# Patient Record
Sex: Female | Born: 1960 | Race: Black or African American | Hispanic: No | Marital: Single | Smoking: Never smoker
Health system: Southern US, Community
[De-identification: ages and names within clinical notes are randomized; demographics above are authoritative.]

## PROBLEM LIST (undated history)

## (undated) DIAGNOSIS — J329 Chronic sinusitis, unspecified: Secondary | ICD-10-CM

## (undated) DIAGNOSIS — E559 Vitamin D deficiency, unspecified: Secondary | ICD-10-CM

## (undated) DIAGNOSIS — R112 Nausea with vomiting, unspecified: Secondary | ICD-10-CM

## (undated) DIAGNOSIS — Z9889 Other specified postprocedural states: Secondary | ICD-10-CM

## (undated) DIAGNOSIS — E7800 Pure hypercholesterolemia, unspecified: Secondary | ICD-10-CM

## (undated) DIAGNOSIS — I10 Essential (primary) hypertension: Secondary | ICD-10-CM

## (undated) DIAGNOSIS — R7303 Prediabetes: Secondary | ICD-10-CM

## (undated) DIAGNOSIS — M1990 Unspecified osteoarthritis, unspecified site: Secondary | ICD-10-CM

## (undated) DIAGNOSIS — Z780 Asymptomatic menopausal state: Secondary | ICD-10-CM

## (undated) HISTORY — PX: COLONOSCOPY: SHX174

## (undated) HISTORY — DX: Prediabetes: R73.03

## (undated) HISTORY — PX: REDUCTION MAMMAPLASTY: SUR839

## (undated) HISTORY — DX: Vitamin D deficiency, unspecified: E55.9

## (undated) HISTORY — DX: Pure hypercholesterolemia, unspecified: E78.00

## (undated) HISTORY — PX: TUBAL LIGATION: SHX77

## (undated) HISTORY — PX: OTHER SURGICAL HISTORY: SHX169

## (undated) HISTORY — DX: Asymptomatic menopausal state: Z78.0

---

## 2006-05-27 HISTORY — PX: BREAST REDUCTION SURGERY: SHX8

## 2015-05-10 ENCOUNTER — Other Ambulatory Visit: Payer: Self-pay

## 2015-05-10 ENCOUNTER — Other Ambulatory Visit: Payer: Self-pay | Admitting: Internal Medicine

## 2015-05-10 DIAGNOSIS — R58 Hemorrhage, not elsewhere classified: Secondary | ICD-10-CM

## 2015-05-10 DIAGNOSIS — N939 Abnormal uterine and vaginal bleeding, unspecified: Secondary | ICD-10-CM

## 2015-05-12 ENCOUNTER — Other Ambulatory Visit: Payer: Self-pay | Admitting: Physician Assistant

## 2015-05-12 DIAGNOSIS — N939 Abnormal uterine and vaginal bleeding, unspecified: Secondary | ICD-10-CM

## 2015-06-28 ENCOUNTER — Emergency Department (HOSPITAL_COMMUNITY)
Admission: EM | Admit: 2015-06-28 | Discharge: 2015-06-28 | Disposition: A | Discharge status: Home / Self Care | Payer: BLUE CROSS/BLUE SHIELD | Attending: Emergency Medicine | Admitting: Emergency Medicine

## 2015-06-28 ENCOUNTER — Encounter (HOSPITAL_COMMUNITY): Payer: Self-pay

## 2015-06-28 ENCOUNTER — Emergency Department (HOSPITAL_COMMUNITY): Payer: BLUE CROSS/BLUE SHIELD

## 2015-06-28 DIAGNOSIS — E876 Hypokalemia: Secondary | ICD-10-CM | POA: Insufficient documentation

## 2015-06-28 DIAGNOSIS — J0110 Acute frontal sinusitis, unspecified: Secondary | ICD-10-CM | POA: Diagnosis not present

## 2015-06-28 DIAGNOSIS — R42 Dizziness and giddiness: Secondary | ICD-10-CM | POA: Diagnosis not present

## 2015-06-28 DIAGNOSIS — Z79899 Other long term (current) drug therapy: Secondary | ICD-10-CM | POA: Insufficient documentation

## 2015-06-28 DIAGNOSIS — I10 Essential (primary) hypertension: Secondary | ICD-10-CM | POA: Diagnosis not present

## 2015-06-28 DIAGNOSIS — R079 Chest pain, unspecified: Secondary | ICD-10-CM | POA: Diagnosis not present

## 2015-06-28 HISTORY — DX: Essential (primary) hypertension: I10

## 2015-06-28 LAB — URINALYSIS, ROUTINE W REFLEX MICROSCOPIC
BILIRUBIN URINE: NEGATIVE
Glucose, UA: NEGATIVE mg/dL
KETONES UR: NEGATIVE mg/dL
Leukocytes, UA: NEGATIVE
NITRITE: NEGATIVE
Protein, ur: NEGATIVE mg/dL
Specific Gravity, Urine: 1.004 — ABNORMAL LOW (ref 1.005–1.030)
pH: 6 (ref 5.0–8.0)

## 2015-06-28 LAB — CBC
HCT: 37.1 % (ref 36.0–46.0)
Hemoglobin: 12.3 g/dL (ref 12.0–15.0)
MCH: 28.9 pg (ref 26.0–34.0)
MCHC: 33.2 g/dL (ref 30.0–36.0)
MCV: 87.1 fL (ref 78.0–100.0)
Platelets: 259 10*3/uL (ref 150–400)
RBC: 4.26 MIL/uL (ref 3.87–5.11)
RDW: 13.1 % (ref 11.5–15.5)
WBC: 6.6 10*3/uL (ref 4.0–10.5)

## 2015-06-28 LAB — BASIC METABOLIC PANEL
Anion gap: 11 (ref 5–15)
BUN: 6 mg/dL (ref 6–20)
CALCIUM: 9.5 mg/dL (ref 8.9–10.3)
CO2: 28 mmol/L (ref 22–32)
CREATININE: 0.81 mg/dL (ref 0.44–1.00)
Chloride: 95 mmol/L — ABNORMAL LOW (ref 101–111)
GFR calc Af Amer: >60 mL/min (ref >60)
GLUCOSE: 136 mg/dL — AB (ref 65–99)
Potassium: 3 mmol/L — ABNORMAL LOW (ref 3.5–5.1)
Sodium: 134 mmol/L — ABNORMAL LOW (ref 135–145)

## 2015-06-28 LAB — URINE MICROSCOPIC-ADD ON

## 2015-06-28 LAB — TROPONIN I

## 2015-06-28 MED ORDER — POTASSIUM CHLORIDE CRYS ER 20 MEQ PO TBCR: 20.0000 meq | EXTENDED_RELEASE_TABLET | Freq: Every day | ORAL | Status: DC

## 2015-06-28 MED ORDER — POTASSIUM CHLORIDE CRYS ER 20 MEQ PO TBCR
40.0000 meq | EXTENDED_RELEASE_TABLET | Freq: Once | ORAL | Status: AC
  Administered 2015-06-28: 40 meq via ORAL
  Filled 2015-06-28: qty 2

## 2015-06-28 MED ORDER — DIAZEPAM 5 MG PO TABS: 5.0000 mg | ORAL_TABLET | Freq: Once | ORAL | Status: DC

## 2015-06-28 MED ORDER — MECLIZINE HCL 50 MG PO TABS: 50.0000 mg | ORAL_TABLET | Freq: Three times a day (TID) | ORAL | Status: DC | PRN

## 2015-06-28 MED ORDER — FLUTICASONE PROPIONATE 50 MCG/ACT NA SUSP: 2.0000 | Freq: Every day | NASAL | Status: DC

## 2015-06-28 MED ORDER — DIAZEPAM 5 MG PO TABS
5.0000 mg | ORAL_TABLET | Freq: Once | ORAL | Status: AC
  Administered 2015-06-28: 5 mg via ORAL
  Filled 2015-06-28: qty 1

## 2015-06-28 MED ORDER — AMOXICILLIN-POT CLAVULANATE 875-125 MG PO TABS: 1.0000 | ORAL_TABLET | Freq: Two times a day (BID) | ORAL | Status: DC

## 2015-06-28 NOTE — ED Provider Notes (Signed)
CSN: [id number]     Arrival date & time 06/28/15  0202 History   First MD Initiated Contact with Patient 06/28/15 0510     Chief Complaint  Patient presents with  . Hypertension     (Consider location/radiation/quality/duration/timing/severity/associated sxs/prior Treatment) HPI  This is a 55 year old female who presents with high blood pressure. Patient reports that she takes blood pressure medications but it is usually in normal range. She's had a two-week history of nasal congestion, head fullness and room spinning dizziness. The dizziness is worse with position changes. She took some over-the-counter nasal drops and feels that this made her blood pressure go up. She denies any fevers. She's not taking any antibiotics. Denies any history of vertigo. Denies any weakness, numbness.  Does report bilateral upper [place] tingling after starting her new blood pressure medication, HCTZ.  Reports occasional chest pain. No active chest pain at this time.  Past Medical History  Diagnosis Date  . Hypertension    Past Surgical History  Procedure Laterality Date  . Cesarean section    . Tummy tuck     No family history on file. Social History  Substance Use Topics  . Smoking status: Never Smoker   . Smokeless tobacco: None  . Alcohol Use: No   OB History    No data available     Review of Systems  Constitutional: Negative for fever.  HENT: Positive for congestion and sinus pressure.   Respiratory: Negative for cough, chest tightness and shortness of breath.   Cardiovascular: Positive for chest pain.  Gastrointestinal: Negative for nausea, vomiting and abdominal pain.  Skin: Negative for wound.  Neurological: Positive for dizziness. Negative for weakness, light-headedness and headaches.  Psychiatric/Behavioral: Negative for confusion.  All other systems reviewed and are negative.     Allergies  Other  Home Medications   Prior to Admission medications   Medication Sig Start  Date End Date Taking? Authorizing Provider  clonazePAM (KLONOPIN) 0.5 MG tablet Take 0.25-0.5 mg by mouth 3 (three) times daily as needed for anxiety.  06/08/15  Yes Historical Provider, MD  hydrochlorothiazide (HYDRODIURIL) 12.5 MG tablet Take 12.5 mg by mouth daily. 06/23/15  Yes Historical Provider, MD  amoxicillin-clavulanate (AUGMENTIN) 875-125 MG tablet Take 1 tablet by mouth 2 (two) times daily. 06/28/15   Shon Baton, MD  diazepam (VALIUM) 5 MG tablet Take 1 tablet (5 mg total) by mouth once. 06/28/15   Shon Baton, MD  fluticasone (FLONASE) 50 MCG/ACT nasal spray Place 2 sprays into both nostrils daily. 06/28/15   Shon Baton, MD  meclizine (ANTIVERT) 50 MG tablet Take 1 tablet (50 mg total) by mouth 3 (three) times daily as needed for dizziness. 06/28/15   Shon Baton, MD  potassium chloride SA (K-DUR,KLOR-CON) 20 MEQ tablet Take 1 tablet (20 mEq total) by mouth daily. 06/28/15   Shon Baton, MD   BP 127/84 mmHg  Pulse 76  Temp(Src) 98.2 F (36.8 C) (Oral)  Resp 14  Ht  (1.626 m)  Wt 170 lb (77.111 kg)  BMI 29.17 kg/m2  SpO2 100% Physical Exam  Constitutional: She is oriented to person, place, and time. She appears well-developed and well-nourished. No distress.  HENT:  Head: Normocephalic and atraumatic.  Mouth/Throat: Oropharynx is clear and moist. No oropharyngeal exudate.  FUllness behind right TM, L TM normal  Neck: Neck supple.  Cardiovascular: Normal rate, regular rhythm and normal heart sounds.   No murmur heard. Pulmonary/Chest: Effort normal and breath sounds  normal. No respiratory distress. She has no wheezes.  Abdominal: Soft. Bowel sounds are normal. There is no tenderness. There is no rebound.  Neurological: She is alert and oriented to person, place, and time.  CN II-XI intact, 5/5 strength in all 4 ext.  No dysmetria to FNF  Skin: Skin is warm and dry.  Psychiatric: She has a normal mood and affect.  Nursing note and vitals  reviewed.   ED Course  Procedures (including critical care time) Labs Review Labs Reviewed  BASIC METABOLIC PANEL - Abnormal; Notable for the following:    Sodium 134 (*)    Potassium 3.0 (*)    Chloride 95 (*)    Glucose, Bld 136 (*)    All other components within normal limits  URINALYSIS, ROUTINE W REFLEX MICROSCOPIC (NOT AT [facility]) - Abnormal; Notable for the following:    Specific Gravity, Urine 1.004 (*)    Hgb urine dipstick SMALL (*)    All other components within normal limits  URINE MICROSCOPIC-ADD ON - Abnormal; Notable for the following:    Squamous Epithelial / LPF 6-30 (*)    Bacteria, UA RARE (*)    All other components within normal limits  CBC  TROPONIN I    Imaging Review Dg Chest 2 View  06/28/2015  CLINICAL DATA:  Dizziness and hypertension EXAM: CHEST  2 VIEW COMPARISON:  None. FINDINGS: Normal heart size and mediastinal contours. No acute infiltrate or edema. No effusion or pneumothorax. No acute osseous findings. IMPRESSION: No active cardiopulmonary disease. Electronically Signed   By: Marnee Spring M.D.   On: 06/28/2015 06:07   I have personally reviewed and evaluated these images and lab results as part of my medical decision-making.   EKG Interpretation   Date/Time:  Wednesday June 28 2015 02:14:24 EST Ventricular Rate:  83 PR Interval:  208 QRS Duration: 88 QT Interval:  368 QTC Calculation: 432 R Axis:   79 Text Interpretation:  Normal sinus rhythm Normal ECG Confirmed by Wai Litt   MD, Tanith Dagostino ([id number]) on 06/28/2015 5:25:49 AM      MDM   Final diagnoses:  Subacute frontal sinusitis  Essential hypertension  Vertigo  Hypokalemia    Patient presents with sinus pressure, congestion and dizziness.  Also noted to be hypertensive.  Nontoxic.  Initial BP 182/100 but improved to 128/85 without intervention.  Suspect subacute sinusitis and resulting vertigo.  Given 2 weeks of symptoms will treat with augmentin, nasal saline.  Patient given  valium with improvement of vertigo.  EKG, CXR and other w/u reassuring.  Was noted to be mildly hypokalemic, likely related to diuretic.  Will prescribe K-dur 20 meq x 10 days.  F/U w/ PCP for recheck.  After history, exam, and medical workup I feel the patient has been appropriately medically screened and is safe for discharge home. Pertinent diagnoses were discussed with the patient. Patient was given return precautions.     Shon Baton, MD 06/28/15 [phone]

## 2015-06-28 NOTE — Discharge Instructions (Signed)
Hypokalemia Hypokalemia means that the amount of potassium in the blood is lower than normal.Potassium is a chemical, called an electrolyte, that helps regulate the amount of fluid in the body. It also stimulates muscle contraction and helps nerves function properly.Most of the body's potassium is inside of cells, and only a very small amount is in the blood. Because the amount in the blood is so small, minor changes can be life-threatening. CAUSES  Antibiotics.  Diarrhea or vomiting.  Using laxatives too much, which can cause diarrhea.  Chronic kidney disease.  Water pills (diuretics).  Eating disorders (bulimia).  Low magnesium level.  Sweating a lot. SIGNS AND SYMPTOMS  Weakness.  Constipation.  Fatigue.  Muscle cramps.  Mental confusion.  Skipped heartbeats or irregular heartbeat (palpitations).  Tingling or numbness. DIAGNOSIS  Your health care provider can diagnose hypokalemia with blood tests. In addition to checking your potassium level, your health care provider may also check other lab tests. TREATMENT Hypokalemia can be treated with potassium supplements taken by mouth or adjustments in your current medicines. If your potassium level is very low, you may need to get potassium through a vein (IV) and be monitored in the hospital. A diet high in potassium is also helpful. Foods high in potassium are:  Nuts, such as peanuts and pistachios.  Seeds, such as sunflower seeds and pumpkin seeds.  Peas, lentils, and lima beans.  Whole grain and bran cereals and breads.  Fresh fruit and vegetables, such as apricots, avocado, bananas, cantaloupe, kiwi, oranges, tomatoes, asparagus, and potatoes.  Orange and tomato juices.  Red meats.  Fruit yogurt. HOME CARE INSTRUCTIONS  Take all medicines as prescribed by your health care provider.  Maintain a healthy diet by including nutritious food, such as fruits, vegetables, nuts, whole grains, and lean meats.  If  you are taking a laxative, be sure to follow the directions on the label. SEEK MEDICAL CARE IF:  Your weakness gets worse.  You feel your heart pounding or racing.  You are vomiting or having diarrhea.  You are diabetic and having trouble keeping your blood glucose in the normal range. SEEK IMMEDIATE MEDICAL CARE IF:  You have chest pain, shortness of breath, or dizziness.  You are vomiting or having diarrhea for more than 2 days.  You faint. MAKE SURE YOU:   Understand these instructions.  Will watch your condition.  Will get help right away if you are not doing well or get worse.   This information is not intended to replace advice given to you by your health care provider. Make sure you discuss any questions you have with your health care provider.   Document Released: 05/13/2005 Document Revised: 06/03/2014 Document Reviewed: 11/13/2012 Elsevier Interactive Patient Education 2016 [employer]. Vertigo Vertigo means you feel like you or your surroundings are moving when they are not. Vertigo can be dangerous if it occurs when you are at work, driving, or performing difficult activities.  CAUSES  Vertigo occurs when there is a conflict of signals sent to your brain from the visual and sensory systems in your body. There are many different causes of vertigo, including:  Infections, especially in the inner ear.  A bad reaction to a drug or misuse of alcohol and medicines.  Withdrawal from drugs or alcohol.  Rapidly changing positions, such as lying down or rolling over in bed.  A migraine headache.  Decreased blood flow to the brain.  Increased pressure in the brain from a head injury, infection, tumor, or  bleeding. SYMPTOMS  You may feel as though the world is spinning around or you are falling to the ground. Because your balance is upset, vertigo can cause nausea and vomiting. You may have involuntary eye movements (nystagmus). DIAGNOSIS  Vertigo is usually  diagnosed by physical exam. If the cause of your vertigo is unknown, your caregiver may perform imaging tests, such as an MRI scan (magnetic resonance imaging). TREATMENT  Most cases of vertigo resolve on their own, without treatment. Depending on the cause, your caregiver may prescribe certain medicines. If your vertigo is related to body position issues, your caregiver may recommend movements or procedures to correct the problem. In rare cases, if your vertigo is caused by certain inner ear problems, you may need surgery. HOME CARE INSTRUCTIONS   Follow your caregiver's instructions.  Avoid driving.  Avoid operating heavy machinery.  Avoid performing any tasks that would be dangerous to you or others during a vertigo episode.  Tell your caregiver if you notice that certain medicines seem to be causing your vertigo. Some of the medicines used to treat vertigo episodes can actually make them worse in some people. SEEK IMMEDIATE MEDICAL CARE IF:   Your medicines do not relieve your vertigo or are making it worse.  You develop problems with talking, walking, weakness, or using your arms, hands, or legs.  You develop severe headaches.  Your nausea or vomiting continues or gets worse.  You develop visual changes.  A family member notices behavioral changes.  Your condition gets worse. MAKE SURE YOU:  Understand these instructions.  Will watch your condition.  Will get help right away if you are not doing well or get worse.   This information is not intended to replace advice given to you by your health care provider. Make sure you discuss any questions you have with your health care provider.   Document Released: 02/20/2005 Document Revised: 08/05/2011 Document Reviewed: 09/05/2014 Elsevier Interactive Patient Education 2016 [employer]. Sinusitis, Adult Sinusitis is redness, soreness, and inflammation of the paranasal sinuses. Paranasal sinuses are air pockets within the  bones of your face. They are located beneath your eyes, in the middle of your forehead, and above your eyes. In healthy paranasal sinuses, mucus is able to drain out, and air is able to circulate through them by way of your nose. However, when your paranasal sinuses are inflamed, mucus and air can become trapped. This can allow bacteria and other germs to grow and cause infection. Sinusitis can develop quickly and last only a short time (acute) or continue over a long period (chronic). Sinusitis that lasts for more than 12 weeks is considered chronic. CAUSES Causes of sinusitis include:  Allergies.  Structural abnormalities, such as displacement of the cartilage that separates your nostrils (deviated septum), which can decrease the air flow through your nose and sinuses and affect sinus drainage.  Functional abnormalities, such as when the small hairs (cilia) that line your sinuses and help remove mucus do not work properly or are not present. SIGNS AND SYMPTOMS Symptoms of acute and chronic sinusitis are the same. The primary symptoms are pain and pressure around the affected sinuses. Other symptoms include:  Upper toothache.  Earache.  Headache.  Bad breath.  Decreased sense of smell and taste.  A cough, which worsens when you are lying flat.  Fatigue.  Fever.  Thick drainage from your nose, which often is green and may contain pus (purulent).  Swelling and warmth over the affected sinuses. DIAGNOSIS Your health  care provider will perform a physical exam. During your exam, your health care provider may perform any of the following to help determine if you have acute sinusitis or chronic sinusitis:  Look in your nose for signs of abnormal growths in your nostrils (nasal polyps).  Tap over the affected sinus to check for signs of infection.  View the inside of your sinuses using an imaging device that has a light attached (endoscope). If your health care provider suspects that  you have chronic sinusitis, one or more of the following tests may be recommended:  Allergy tests.  Nasal culture. A sample of mucus is taken from your nose, sent to a lab, and screened for bacteria.  Nasal cytology. A sample of mucus is taken from your nose and examined by your health care provider to determine if your sinusitis is related to an allergy. TREATMENT Most cases of acute sinusitis are related to a viral infection and will resolve on their own within 10 days. Sometimes, medicines are prescribed to help relieve symptoms of both acute and chronic sinusitis. These may include pain medicines, decongestants, nasal steroid sprays, or saline sprays. However, for sinusitis related to a bacterial infection, your health care provider will prescribe antibiotic medicines. These are medicines that will help kill the bacteria causing the infection. Rarely, sinusitis is caused by a fungal infection. In these cases, your health care provider will prescribe antifungal medicine. For some cases of chronic sinusitis, surgery is needed. Generally, these are cases in which sinusitis recurs more than 3 times per year, despite other treatments. HOME CARE INSTRUCTIONS  Drink plenty of water. Water helps thin the mucus so your sinuses can drain more easily.  Use a humidifier.  Inhale steam 3-4 times a day (for example, sit in the bathroom with the shower running).  Apply a warm, moist washcloth to your face 3-4 times a day, or as directed by your health care provider.  Use saline nasal sprays to help moisten and clean your sinuses.  Take medicines only as directed by your health care provider.  If you were prescribed either an antibiotic or antifungal medicine, finish it all even if you start to feel better. SEEK IMMEDIATE MEDICAL CARE IF:  You have increasing pain or severe headaches.  You have nausea, vomiting, or drowsiness.  You have swelling around your face.  You have vision  problems.  You have a stiff neck.  You have difficulty breathing.   This information is not intended to replace advice given to you by your health care provider. Make sure you discuss any questions you have with your health care provider.   Document Released: 05/13/2005 Document Revised: 06/03/2014 Document Reviewed: 05/28/2011 Elsevier Interactive Patient Education [employer]

## 2015-06-28 NOTE — ED Notes (Signed)
Patient transported to X-ray 

## 2015-06-28 NOTE — ED Notes (Signed)
Per [facility], pt from home for HTN today. Usually runs in the 1-teens/ 70-80's. Took some OTC nasal drops today that she has taken before but after doing so she started feeling weird. She has felt dizzy all week and started having nasal congestion the past few days.

## 2015-11-17 ENCOUNTER — Encounter (HOSPITAL_COMMUNITY): Payer: Self-pay

## 2015-11-17 ENCOUNTER — Other Ambulatory Visit: Payer: Self-pay

## 2015-11-17 ENCOUNTER — Emergency Department (HOSPITAL_COMMUNITY)
Admission: EM | Admit: 2015-11-17 | Discharge: 2015-11-17 | Disposition: A | Discharge status: Home / Self Care | Payer: BLUE CROSS/BLUE SHIELD | Attending: Emergency Medicine | Admitting: Emergency Medicine

## 2015-11-17 ENCOUNTER — Emergency Department (HOSPITAL_COMMUNITY): Payer: BLUE CROSS/BLUE SHIELD

## 2015-11-17 DIAGNOSIS — R531 Weakness: Secondary | ICD-10-CM | POA: Insufficient documentation

## 2015-11-17 DIAGNOSIS — Z79899 Other long term (current) drug therapy: Secondary | ICD-10-CM | POA: Insufficient documentation

## 2015-11-17 DIAGNOSIS — R002 Palpitations: Secondary | ICD-10-CM | POA: Diagnosis not present

## 2015-11-17 DIAGNOSIS — I10 Essential (primary) hypertension: Secondary | ICD-10-CM | POA: Insufficient documentation

## 2015-11-17 LAB — URINALYSIS, ROUTINE W REFLEX MICROSCOPIC
BILIRUBIN URINE: NEGATIVE
GLUCOSE, UA: NEGATIVE mg/dL
Ketones, ur: NEGATIVE mg/dL
Nitrite: NEGATIVE
Protein, ur: NEGATIVE mg/dL
SPECIFIC GRAVITY, URINE: 1.007 (ref 1.005–1.030)
pH: 7 (ref 5.0–8.0)

## 2015-11-17 LAB — CBC
HCT: 34.9 % — ABNORMAL LOW (ref 36.0–46.0)
Hemoglobin: 11.1 g/dL — ABNORMAL LOW (ref 12.0–15.0)
MCH: 27.5 pg (ref 26.0–34.0)
MCHC: 31.8 g/dL (ref 30.0–36.0)
MCV: 86.6 fL (ref 78.0–100.0)
PLATELETS: 260 10*3/uL (ref 150–400)
RBC: 4.03 MIL/uL (ref 3.87–5.11)
RDW: 13.3 % (ref 11.5–15.5)
WBC: 6.3 10*3/uL (ref 4.0–10.5)

## 2015-11-17 LAB — I-STAT TROPONIN, ED: Troponin i, poc: 0 ng/mL (ref 0.00–0.08)

## 2015-11-17 LAB — BASIC METABOLIC PANEL
Anion gap: 4 — ABNORMAL LOW (ref 5–15)
BUN: 7 mg/dL (ref 6–20)
CHLORIDE: 107 mmol/L (ref 101–111)
CO2: 29 mmol/L (ref 22–32)
CREATININE: 0.72 mg/dL (ref 0.44–1.00)
Calcium: 9.1 mg/dL (ref 8.9–10.3)
GFR calc Af Amer: >60 mL/min (ref >60)
Glucose, Bld: 116 mg/dL — ABNORMAL HIGH (ref 65–99)
Potassium: 3.5 mmol/L (ref 3.5–5.1)
SODIUM: 140 mmol/L (ref 135–145)

## 2015-11-17 LAB — URINE MICROSCOPIC-ADD ON

## 2015-11-17 MED ORDER — SODIUM CHLORIDE 0.9 % IV BOLUS (SEPSIS)
1000.0000 mL | Freq: Once | INTRAVENOUS | Status: AC
  Administered 2015-11-17: 1000 mL via INTRAVENOUS

## 2015-11-17 NOTE — ED Notes (Signed)
Pt arrived via EMS c/o palpitations. Recent UTI, with low fever reported.  Recent BP med change Norvasc to enalapril.  EMS gave of NS.  Denies any pain

## 2015-11-17 NOTE — ED Notes (Signed)
Pt stable, ambulatory, states understanding of discharge instructions 

## 2015-11-17 NOTE — ED Provider Notes (Signed)
CSN: [id number]     Arrival date & time 11/17/15  1930 History   First MD Initiated Contact with Patient 11/17/15 2039     Chief Complaint  Patient presents with  . Palpitations     (Consider location/radiation/quality/duration/timing/severity/associated sxs/prior Treatment) HPI  55 year old female presents with palpitations. Patient states that she's been changed on her blood pressure medicine multiple times by her PCP recently. Most recently was changed from Norvasc to enalapril. This is been over the last 1 week. Has been paying more frequently but no pain.*Piece P today who noticed blood in her urine and thought she might have a UTI. When she got home she started having palpitations. This went away for several hours and then came back. Lasted about 30 minutes. Seem to be worse whenever she would stand up quickly. If she went slowly the symptoms seem to be better. No current fevers. Has been feeling generally weak and fatigued for the last couple months. Has also been intermittent like dizzy since then. Neither of these symptoms is new but the weakness seems worse over the last 1 week. No current headache, chest pain, shortness of breath, or abdominal pain. No vomiting or diarrhea. No chest pain at all with these palpitations.  Past Medical History  Diagnosis Date  . Hypertension    Past Surgical History  Procedure Laterality Date  . Cesarean section    . Tummy tuck     History reviewed. No pertinent family history. Social History  Substance Use Topics  . Smoking status: Never Smoker   . Smokeless tobacco: None  . Alcohol Use: No   OB History    No data available     Review of Systems  Constitutional: Positive for fatigue. Negative for fever.  Respiratory: Negative for shortness of breath.   Cardiovascular: Positive for palpitations. Negative for chest pain.  Gastrointestinal: Negative for vomiting, abdominal pain and diarrhea.  Genitourinary: Positive for frequency. Negative  for dysuria.  Neurological: Positive for weakness. Negative for headaches.  All other systems reviewed and are negative.     Allergies  Other  Home Medications   Prior to Admission medications   Medication Sig Start Date End Date Taking? Authorizing Provider  clonazePAM (KLONOPIN) 0.5 MG tablet Take 0.25-0.5 mg by mouth 3 (three) times daily as needed for anxiety.  06/08/15  Yes Historical Provider, MD  doxycycline (ADOXA) 100 MG tablet Take 100 mg by mouth daily as needed (for traveling).  10/25/15  Yes Historical Provider, MD  enalapril (VASOTEC) 5 MG tablet Take 5 mg by mouth daily.   Yes Historical Provider, MD  fluticasone (FLONASE) 50 MCG/ACT nasal spray Place 2 sprays into both nostrils daily. Patient taking differently: Place 2 sprays into both nostrils daily as needed for allergies.  06/28/15  Yes Shon Batonourtney F Horton, MD  ibuprofen (ADVIL,MOTRIN) 200 MG tablet Take 400 mg by mouth every 6 (six) hours as needed for moderate pain.   Yes Historical Provider, MD  loratadine (CLARITIN) 10 MG tablet Take 10 mg by mouth daily as needed for allergies.   Yes Historical Provider, MD  amoxicillin-clavulanate (AUGMENTIN) 875-125 MG tablet Take 1 tablet by mouth 2 (two) times daily. 06/28/15   Shon Batonourtney F Horton, MD  meclizine (ANTIVERT) 50 MG tablet Take 1 tablet (50 mg total) by mouth 3 (three) times daily as needed for dizziness. 06/28/15   Shon Batonourtney F Horton, MD  nitrofurantoin, macrocrystal-monohydrate, (MACROBID) 100 MG capsule Take 100 mg by mouth 2 (two) times daily. 11/17/15   Historical  Provider, MD  potassium chloride SA (K-DUR,KLOR-CON) 20 MEQ tablet Take 1 tablet (20 mEq total) by mouth daily. 06/28/15   Shon Batonourtney F Horton, MD   BP 143/93 mmHg  Pulse 81  Temp(Src) 98.6 F (37 C) (Oral)  Resp 19  SpO2 100% Physical Exam  Constitutional: She is oriented to person, place, and time. She appears well-developed and well-nourished.  HENT:  Head: Normocephalic and atraumatic.  Right Ear:  External ear normal.  Left Ear: External ear normal.  Nose: Nose normal.  Mouth/Throat: Oropharynx is clear and moist.  Eyes: EOM are normal. Pupils are equal, round, and reactive to light. Right eye exhibits no discharge. Left eye exhibits no discharge.  Cardiovascular: Normal rate, regular rhythm and normal heart sounds.   No murmur heard. Pulmonary/Chest: Effort normal and breath sounds normal.  Abdominal: Soft. There is no tenderness.  Neurological: She is alert and oriented to person, place, and time.  CN 3-12 grossly intact. 5/5 strength in all 4 extremities. Grossly normal sensation. Normal gait  Skin: Skin is warm and dry.  Nursing note and vitals reviewed.   ED Course  Procedures (including critical care time) Labs Review Labs Reviewed  BASIC METABOLIC PANEL - Abnormal; Notable for the following:    Glucose, Bld 116 (*)    Anion gap 4 (*)    All other components within normal limits  CBC - Abnormal; Notable for the following:    Hemoglobin 11.1 (*)    HCT 34.9 (*)    All other components within normal limits  URINALYSIS, ROUTINE W REFLEX MICROSCOPIC (NOT [facility]) - Abnormal; Notable for the following:    Color, Urine STRAW (*)    Hgb urine dipstick TRACE (*)    Leukocytes, UA SMALL (*)    All other components within normal limits  URINE MICROSCOPIC-ADD ON - Abnormal; Notable for the following:    Squamous Epithelial / LPF 0-5 (*)    Bacteria, UA RARE (*)    All other components within normal limits  I-STAT TROPOININ, ED    Imaging Review Dg Chest 2 View  11/17/2015  CLINICAL DATA:  Palpitations, recent urinary tract infection EXAM: CHEST  2 VIEW COMPARISON:  06/28/2015 FINDINGS: Normal mediastinum and cardiac silhouette. Normal pulmonary vasculature. No evidence of effusion, infiltrate, or pneumothorax. No acute bony abnormality. The lateral projection there is a rounded density projecting over the T11 vertebral body which is not seen on comparison exam. IMPRESSION:  1. No acute cardiopulmonary findings. 2. New density projecting over the T11 vertebral body. Cannot exclude a sclerotic bone lesion or pulmonary nodule. Recommend follow-up CT of thorax without contrast. This could be done on a nonemergent basis. Electronically Signed   By: Genevive BiStewart  Edmunds M.D.   On: 11/17/2015 20:42   I have personally reviewed and evaluated these images and lab results as part of my medical decision-making.   EKG Interpretation   Date/Time:  Friday November 17 2015 19:46:53 EDT Ventricular Rate:  81 PR Interval:    QRS Duration: 74 QT Interval:  353 QTC Calculation: 410 R Axis:   70 Text Interpretation:  Sinus rhythm Abnormal R-wave progression, early  transition Diffuse ST elevation, no reciprocal changes no significant  change since Feb 2017 Confirmed by Criss AlvineGOLDSTON MD, Jlen Wintle [phone]([id number]) on 11/17/2015  8:04:55 PM      MDM   Final diagnoses:  Palpitations  Generalized weakness    Patient currently does not have palpitations and her ECG shows normal sinus rhythm. Workup in the ED is  benign. She has urinary frequency but no dysuria. No findings suggestive of UTI. Frequency is likely due to now being on a diuretic. She has been feeling generally weak and dizzy for months. No obvious explanation. Recommend f/u with PCP for further testing, including thyroid. Benign vital signs. Normal neuro exam. D/c with return precautions.    Pricilla Loveless, MD 11/18/15 [phone]

## 2015-11-20 ENCOUNTER — Emergency Department (HOSPITAL_COMMUNITY)
Admission: EM | Admit: 2015-11-20 | Discharge: 2015-11-21 | Disposition: A | Discharge status: Home / Self Care | Payer: BLUE CROSS/BLUE SHIELD | Attending: Emergency Medicine | Admitting: Emergency Medicine

## 2015-11-20 ENCOUNTER — Encounter (HOSPITAL_COMMUNITY): Payer: Self-pay | Admitting: Emergency Medicine

## 2015-11-20 ENCOUNTER — Emergency Department (HOSPITAL_COMMUNITY): Payer: BLUE CROSS/BLUE SHIELD

## 2015-11-20 ENCOUNTER — Ambulatory Visit (HOSPITAL_COMMUNITY)
Admission: EM | Admit: 2015-11-20 | Discharge: 2015-11-20 | Disposition: A | Discharge status: Nursing Home (Includes ICF) | Payer: BLUE CROSS/BLUE SHIELD | Attending: Family Medicine | Admitting: Family Medicine

## 2015-11-20 ENCOUNTER — Other Ambulatory Visit: Payer: Self-pay

## 2015-11-20 ENCOUNTER — Encounter (HOSPITAL_COMMUNITY): Payer: Self-pay

## 2015-11-20 DIAGNOSIS — R002 Palpitations: Secondary | ICD-10-CM | POA: Diagnosis not present

## 2015-11-20 DIAGNOSIS — R079 Chest pain, unspecified: Secondary | ICD-10-CM | POA: Insufficient documentation

## 2015-11-20 DIAGNOSIS — Z79899 Other long term (current) drug therapy: Secondary | ICD-10-CM | POA: Diagnosis not present

## 2015-11-20 DIAGNOSIS — I159 Secondary hypertension, unspecified: Secondary | ICD-10-CM

## 2015-11-20 DIAGNOSIS — R0789 Other chest pain: Secondary | ICD-10-CM

## 2015-11-20 DIAGNOSIS — I10 Essential (primary) hypertension: Secondary | ICD-10-CM | POA: Insufficient documentation

## 2015-11-20 DIAGNOSIS — Z91040 Latex allergy status: Secondary | ICD-10-CM | POA: Insufficient documentation

## 2015-11-20 LAB — TSH: TSH: 0.92 u[IU]/mL (ref 0.350–4.500)

## 2015-11-20 LAB — CBC
HEMATOCRIT: 36.3 % (ref 36.0–46.0)
Hemoglobin: 11.7 g/dL — ABNORMAL LOW (ref 12.0–15.0)
MCH: 28.1 pg (ref 26.0–34.0)
MCHC: 32.2 g/dL (ref 30.0–36.0)
MCV: 87.1 fL (ref 78.0–100.0)
PLATELETS: 283 10*3/uL (ref 150–400)
RBC: 4.17 MIL/uL (ref 3.87–5.11)
RDW: 13.3 % (ref 11.5–15.5)
WBC: 7 10*3/uL (ref 4.0–10.5)

## 2015-11-20 LAB — BASIC METABOLIC PANEL
Anion gap: 5 (ref 5–15)
BUN: 8 mg/dL (ref 6–20)
CO2: 30 mmol/L (ref 22–32)
CREATININE: 0.72 mg/dL (ref 0.44–1.00)
Calcium: 9.4 mg/dL (ref 8.9–10.3)
Chloride: 105 mmol/L (ref 101–111)
GFR calc Af Amer: >60 mL/min (ref >60)
GLUCOSE: 108 mg/dL — AB (ref 65–99)
POTASSIUM: 3.8 mmol/L (ref 3.5–5.1)
SODIUM: 140 mmol/L (ref 135–145)

## 2015-11-20 LAB — TROPONIN I: Troponin I: <0.03 ng/mL (ref <0.031)

## 2015-11-20 LAB — I-STAT TROPONIN, ED: Troponin i, poc: 0 ng/mL (ref 0.00–0.08)

## 2015-11-20 MED ORDER — ACETAMINOPHEN 500 MG PO TABS
1000.0000 mg | ORAL_TABLET | Freq: Once | ORAL | Status: AC
  Administered 2015-11-20: 1000 mg via ORAL
  Filled 2015-11-20: qty 2

## 2015-11-20 MED ORDER — NITROGLYCERIN 0.4 MG SL SUBL
0.4000 mg | SUBLINGUAL_TABLET | SUBLINGUAL | Status: DC | PRN
  Administered 2015-11-20: 0.4 mg via SUBLINGUAL
  Filled 2015-11-20: qty 1

## 2015-11-20 NOTE — ED Notes (Signed)
patient presents with elevated BP and complains of chest discomfort and palpitations, she was seen in the ED on Saturday 11/18/2015 and she was taking enalapril 5 mg and she increased her medication to three 5 mg tablets to get BP down  No acute distress

## 2015-11-20 NOTE — ED Notes (Signed)
[facility], HTN. Denies SOB. Pt has had recent changes in BP medications. 192/103

## 2015-11-20 NOTE — ED Provider Notes (Signed)
CSN: [id number]     Arrival date & time 11/20/15  1830 History   First MD Initiated Contact with Patient 11/20/15 1934     Chief Complaint  Patient presents with  . Hypertension   (Consider location/radiation/quality/duration/timing/severity/associated sxs/prior Treatment) HPI History obtained from patient: Location:  Chest pain Context/Duration: Onset of high blood pressure last week. PCP started new medication which caused her heart to race, so was seen in ED and discharged home. Unable to see pcp today, bp is no better and she feels worse.   Severity: 5  Quality:ache, vague hard to explain Timing:      Now constant      Home Treatment: took 3 blood pressure pills today and now feels really bad Associated symptoms:  Sensation of passing out Family History:HTN    Past Medical History  Diagnosis Date  . Hypertension    Past Surgical History  Procedure Laterality Date  . Cesarean section    . Tummy tuck     No family history on file. Social History  Substance Use Topics  . Smoking status: Never Smoker   . Smokeless tobacco: Never Used  . Alcohol Use: No   OB History    No data available     Review of Systems  Denies: HEADACHE, NAUSEA, ABDOMINAL PAIN,  CONGESTION, DYSURIA, SHORTNESS OF BREATH  Allergies  Other  Home Medications   Prior to Admission medications   Medication Sig Start Date End Date Taking? Authorizing Provider  enalapril (VASOTEC) 5 MG tablet Take 5 mg by mouth daily.   Yes Historical Provider, MD  fluticasone (FLONASE) 50 MCG/ACT nasal spray Place 2 sprays into both nostrils daily. Patient taking differently: Place 2 sprays into both nostrils daily as needed for allergies.  06/28/15  Yes Shon Batonourtney F Horton, MD  nitrofurantoin, macrocrystal-monohydrate, (MACROBID) 100 MG capsule Take 100 mg by mouth 2 (two) times daily. 11/17/15  Yes Historical Provider, MD  amoxicillin-clavulanate (AUGMENTIN) 875-125 MG tablet Take 1 tablet by mouth 2 (two) times daily.  06/28/15   Shon Batonourtney F Horton, MD  clonazePAM (KLONOPIN) 0.5 MG tablet Take 0.25-0.5 mg by mouth 3 (three) times daily as needed for anxiety.  06/08/15   Historical Provider, MD  doxycycline (ADOXA) 100 MG tablet Take 100 mg by mouth daily as needed (for traveling).  10/25/15   Historical Provider, MD  ibuprofen (ADVIL,MOTRIN) 200 MG tablet Take 400 mg by mouth every 6 (six) hours as needed for moderate pain.    Historical Provider, MD  loratadine (CLARITIN) 10 MG tablet Take 10 mg by mouth daily as needed for allergies.    Historical Provider, MD  meclizine (ANTIVERT) 50 MG tablet Take 1 tablet (50 mg total) by mouth 3 (three) times daily as needed for dizziness. 06/28/15   Shon Batonourtney F Horton, MD  potassium chloride SA (K-DUR,KLOR-CON) 20 MEQ tablet Take 1 tablet (20 mEq total) by mouth daily. 06/28/15   Shon Batonourtney F Horton, MD   Meds Ordered and Administered this Visit  Medications - No data to display  BP 184/105 mmHg  Pulse 90  Temp(Src) 98.4 F (36.9 C) (Oral)  Resp 18  SpO2 100% No data found.   Physical Exam NURSES NOTES AND VITAL SIGNS REVIEWED. CONSTITUTIONAL: Well developed, well nourished, no acute distress HEENT: normocephalic, atraumatic EYES: Conjunctiva normal NECK:normal ROM, supple, no adenopathy PULMONARY:No respiratory distress, normal effort ABDOMINAL: Soft, ND, NT BS+, No CVAT MUSCULOSKELETAL: Normal ROM of all extremities,  SKIN: warm and dry without rash PSYCHIATRIC: Mood and affect, behavior  are normal  ED Course  Procedures (including critical care time)  Labs Review Labs Reviewed - No data to display  Imaging Review No results found.   Visual Acuity Review  Right Eye Distance:   Left Eye Distance:   Bilateral Distance:    Right Eye Near:   Left Eye Near:    Bilateral Near:         MDM   1. Other chest pain   2. Secondary hypertension, unspecified    Pt should be seen in ER as she now has chest pain.  Transfer via [facility]    Tharon AquasFrank C  Helmi Hechavarria, PA 11/20/15 2040

## 2015-11-20 NOTE — ED Provider Notes (Signed)
The patient is a 55 year old female, she has a history of anxiety as well as intermittent palpitations and some hypertension. She is currently taking an ACE inhibitor, she has very good control of her blood pressure and takes her blood pressure twice a day religiously, often having numbers at 110 systolic, occasionally up as high as 170 systolic but then it improves afterwards. She reports that she has had intermittent palpitations, this has been going on since February, she states it occurs at all times of the day, not necessarily with exertion, there is no shortness of breath with this but she does feel fatigued and feels like she gets a dry mouth and shakes all over. She does not lose consciousness. She does not like the doctor she is currently with, she was going to get a Holter monitor test but states that she did not have symptoms for long enough that they decided against it. On my exam the patient is calm, collected, she becomes anxious when she talks about her results but does not have any tachycardia, she has no PVCs during our discussion. She has no peripheral edema, she has clear heart and lung sounds without murmurs. Her EKG showed that there is some occasional ectopy. These are PVCs.  I feel that the patient is stable for discharge to follow-up with outpatient follow-up for cardiac Holter monitor testing and potential cardiology follow-up. I do not think there is any specific cause of the patient's ectopy, he does not appear to be ischemic or pathologic. The patient appears stable for discharge. She agrees to take her enalapril only as prescribed.  I saw and evaluated the patient, reviewed the resident's note and I agree with the findings and plan.   EKG Interpretation  Date/Time:  Monday November 20 2015 23:10:47 EDT Ventricular Rate:  101 PR Interval:    QRS Duration: 93 QT Interval:  344 QTC Calculation: 446 R Axis:   78 Text Interpretation:  Sinus tachycardia Premature ventricular complexes  Baseline wander in lead(s) V2 V3 V4 V5 V6 Partial missing lead(s): V2 V3 V4 V5 V6 Since last tracing rate faster and ectopy present Confirmed by Ketih Goodie  MD, Bari Handshoe ([id number]) on 11/20/2015 11:42:14 PM        Final diagnoses:  Chest pain, unspecified chest pain type      Eber HongBrian Stanislav Gervase, MD 11/21/15 1039

## 2015-11-20 NOTE — ED Notes (Signed)
Pt ambulated to bathroom, had episode of "chest palpitations" EKG was obtained and taken to EDP.

## 2015-11-20 NOTE — ED Notes (Signed)
Called main lab to add on troponin.  

## 2015-11-20 NOTE — ED Provider Notes (Signed)
CSN: [id number]     Arrival date & time 11/20/15  2022 History   First MD Initiated Contact with Patient 11/20/15 2046     Chief Complaint  Patient presents with  . Palpitations  . Chest Pain     (Consider location/radiation/quality/duration/timing/severity/associated sxs/prior Treatment) HPI Patient is being sent from urgent care today due to concerns of palpitations and chest pain. Patient has a history of prediabetes, hypertension, elevated cholesterol but does not take anything for the cholesterol as it is diet managed. Patient does take Klonopin for anxiety as needed and has a history of anxiety. She states that she has had a different medications attempted for hypertension that she's been unable to tolerate these due to multiple side effects. She is concerned that since starting enalapril approximately a week and half ago, she's developed intermittent palpitations with chest pain. She states the chest pain is difficult to describe but was in the midline and nonradiating. She did not feel short of breath during the event but she stated the pain was so severe she felt lightheaded. She did not pass out. No history in her family of early heart attack or sudden cardiac death. She is not on any birth control and denies any leg swelling or leg pain. She denies any hemoptysis or cough or fevers. No recent immobilization or surgery. Patient denies any drug use. Patient attempted to take multiple blood pressure medications today when she started having the episode but when she got to urgent care, her blood pressure was so high that she was sent here for further evaluation.  Past Medical History  Diagnosis Date  . Hypertension    Past Surgical History  Procedure Laterality Date  . Cesarean section    . Tummy tuck     No family history on file. Social History  Substance Use Topics  . Smoking status: Never Smoker   . Smokeless tobacco: Never Used  . Alcohol Use: No   OB History    No data  available     Review of Systems  Constitutional: Negative for fever.  Respiratory: Negative for cough and shortness of breath.   Cardiovascular: Positive for palpitations. Negative for chest pain and leg swelling.  Gastrointestinal: Negative for vomiting.  Skin: Negative for wound.  Allergic/Immunologic: Negative for immunocompromised state.  All other systems reviewed and are negative.     Allergies  Other and Latex  Home Medications   Prior to Admission medications   Medication Sig Start Date End Date Taking? Authorizing Provider  clonazePAM (KLONOPIN) 0.5 MG tablet Take 0.25-0.5 mg by mouth 3 (three) times daily as needed for anxiety.  06/08/15  Yes Historical Provider, MD  enalapril (VASOTEC) 5 MG tablet Take 5 mg by mouth daily.   Yes Historical Provider, MD  fluticasone (FLONASE) 50 MCG/ACT nasal spray Place 2 sprays into both nostrils daily. Patient taking differently: Place 2 sprays into both nostrils daily as needed for allergies.  06/28/15  Yes Shon Batonourtney F Horton, MD  ibuprofen (ADVIL,MOTRIN) 200 MG tablet Take 200 mg by mouth every 6 (six) hours as needed for headache or mild pain.   Yes Historical Provider, MD  nitrofurantoin, macrocrystal-monohydrate, (MACROBID) 100 MG capsule Take 100 mg by mouth 2 (two) times daily. 11/17/15  Yes Historical Provider, MD  amoxicillin-clavulanate (AUGMENTIN) 875-125 MG tablet Take 1 tablet by mouth 2 (two) times daily. Patient not taking: Reported on 11/20/2015 06/28/15   Shon Batonourtney F Horton, MD  doxycycline (ADOXA) 100 MG tablet Take 100 mg by mouth  daily as needed (for traveling).  10/25/15   Historical Provider, MD  meclizine (ANTIVERT) 50 MG tablet Take 1 tablet (50 mg total) by mouth 3 (three) times daily as needed for dizziness. Patient not taking: Reported on 11/20/2015 06/28/15   Shon Batonourtney F Horton, MD   BP 165/94 mmHg  Pulse 68  Temp(Src) 98.5 F (36.9 C) (Oral)  Resp 14  Ht 5\' 5"  (1.651 m)  Wt 79.379 kg  BMI 29.12 kg/m2  SpO2  100% Physical Exam  Constitutional: She is oriented to person, place, and time. She appears well-developed and well-nourished. No distress.  HENT:  Head: Normocephalic and atraumatic.  Eyes: Conjunctivae are normal. Right eye exhibits no discharge. Left eye exhibits no discharge.  Neck: Normal range of motion. Neck supple.  Cardiovascular: Normal rate, regular rhythm, normal heart sounds and intact distal pulses.   No murmur heard. Pulmonary/Chest: Effort normal and breath sounds normal. No respiratory distress.  Abdominal: Soft. Bowel sounds are normal. She exhibits no distension and no mass. There is no tenderness. There is no rebound and no guarding.  Musculoskeletal: She exhibits no edema ( Lower extremities, no asymmetry, no calf tenderness).  Neurological: She is alert and oriented to person, place, and time.  Skin: Skin is warm. No rash noted.  Psychiatric:  Appears anxious  Nursing note and vitals reviewed.   ED Course  Procedures (including critical care time) Labs Review Labs Reviewed  BASIC METABOLIC PANEL - Abnormal; Notable for the following:    Glucose, Bld 108 (*)    All other components within normal limits  CBC - Abnormal; Notable for the following:    Hemoglobin 11.7 (*)    All other components within normal limits  TSH  TROPONIN I  Rosezena SensorI-STAT TROPOININ, ED    Imaging Review Dg Chest 2 View  11/20/2015  CLINICAL DATA:  Chest pain. EXAM: CHEST  2 VIEW COMPARISON:  November 17, 2015 FINDINGS: Probable small granuloma in the left apex. The heart, hila, mediastinum, lungs, and pleura are unremarkable. IMPRESSION: No active cardiopulmonary disease. Electronically Signed   By: Gerome Samavid  Williams III M.D   On: 11/20/2015 21:06   I have personally reviewed and evaluated these images and lab results as part of my medical decision-making.   EKG Interpretation None     EKG is normal sinus rhythm, normal intervals, benign repolarization. No ST depression, T-wave inversion, Q  waves in lateralizing leads suggest acute ischemia. No significant change when compared to 11/17/2015 EKG.  MDM   Final diagnoses:  Chest pain, unspecified chest pain type    Patient does not have any evidence of PE. We'll score is 0. Atypical story and no further testing indicated. Does endorse some chest pain but it's not associated with shortness of breath, radiation, nonexertional. EKG is normal. Extremely low risk for ACS and initial troponin is negative. Heart score is 2. Pending second troponin. TSH is within normal limits. She does endorse feeling lightheaded but has not actually passed out. She's had multiple episodes of fevers side effects of medications in the past, to a different antihypertensive according to the patient. Question whether she is actually having adverse side effects and question whether patient has an underlying other process. Telemetry monitoring here does not reveal any evidence of arrhythmia. I do not suspect patient is having a side effect from the enalapril. Patient does appear highly anxious but it would likely be helpful to have a Holter monitor to assess with these episodes of tachycardia showing. Unable to provide  a Holter from here however. Will advise patient to follow up with primary care provider for this further evaluation if felt necessary by primary care provider. Another possibility is that patient is having panic attacks as patient does have a history of anxiety. TSH is unremarkable. Patient is not anemic. She is not endorsing any pregnancy.   Recent visit for same on 6/23.  Patient did endorse some chest pain while in ED. Repeat EKG obtained and does reveal some PVCs. Likely this is what patient is experiencing regarding palpitations.   Second trop negative. Pt dc in good condtionit with strict return Precautions. Follow up with cardiology. Written follow-up instructions given as well as plan. She is to continue taking her antihypertensives as  prescribed.    Sidney Ace, MD 11/23/15 1519  Eber Hong, MD 11/26/15 [phone]

## 2015-11-21 ENCOUNTER — Ambulatory Visit: Payer: Self-pay | Admitting: Family Medicine

## 2015-11-21 LAB — I-STAT TROPONIN, ED: Troponin i, poc: 0 ng/mL (ref 0.00–0.08)

## 2015-11-21 NOTE — Discharge Instructions (Signed)
Your thyroid testing was normal. Your EKG and heart stressed labs (troponin) were normal and you are not experiencing a heart attack. Your chest x-ray was negative for a pneumonia, lung cancer, artery problem, or other emergency disease. You are not experiencing severe end organ damage from your elevated blood pressure. I do not suspect you are having a side effect from the enalapril, take only as directed. Follow-up with cardiology to further discuss what other evaluation is necessary, you may be a candidate for a Holter monitor.

## 2015-11-21 NOTE — ED Notes (Signed)
Repeated trop negative, Ward MD aware. Pt ready for DC

## 2015-11-21 NOTE — ED Notes (Signed)
EDP at bedside  

## 2016-01-15 ENCOUNTER — Ambulatory Visit (HOSPITAL_COMMUNITY)
Admission: EM | Admit: 2016-01-15 | Discharge: 2016-01-15 | Disposition: A | Discharge status: Home / Self Care | Payer: BLUE CROSS/BLUE SHIELD

## 2016-01-15 ENCOUNTER — Encounter (HOSPITAL_COMMUNITY): Payer: Self-pay | Admitting: *Deleted

## 2016-01-15 DIAGNOSIS — J069 Acute upper respiratory infection, unspecified: Secondary | ICD-10-CM

## 2016-01-15 MED ORDER — OXYMETAZOLINE HCL 0.05 % NA SOLN: 1.0000 | Freq: Two times a day (BID) | NASAL | 0 refills | Status: DC

## 2016-01-15 NOTE — Discharge Instructions (Signed)
Continue to push fluids and use steam. Use afrin for 3-5 days only.

## 2016-01-15 NOTE — ED Triage Notes (Signed)
Patient reports sore throat with chills, no fever, since yesterday after taking a flight on Sunday. Is taking doxy for travel at this current time.

## 2016-01-15 NOTE — ED Provider Notes (Signed)
CSN: [id number]     Arrival date & time 01/15/16  1858 History   First MD Initiated Contact with Patient 01/15/16 1947     Chief Complaint  Patient presents with  . Sore Throat  . Chills   (Consider location/radiation/quality/duration/timing/severity/associated sxs/prior Treatment) Patient presents with nasal congestion sore throat and non productive cough X.1 day  Condition is acute in nature. Condition is made better by nothing. Condition is made worse by nothing. Patient denies any relief from flonase prior to there arrival at this facility. Patient states that she has recently returned from a trip to [place] and is taking doxy. Patient denies any fevers        Past Medical History:  Diagnosis Date  . Hypertension    Past Surgical History:  Procedure Laterality Date  . CESAREAN SECTION    . tummy tuck     History reviewed. No pertinent family history. Social History  Substance Use Topics  . Smoking status: Never Smoker  . Smokeless tobacco: Never Used  . Alcohol use No   OB History    No data available     Review of Systems  Constitutional: Negative.   HENT: Positive for congestion, sinus pressure and sore throat.   Respiratory: Positive for cough (non productive).     Allergies  Other and Latex  Home Medications   Prior to Admission medications   Medication Sig Start Date End Date Taking? Authorizing Provider  diltiazem (DILACOR XR) 120 MG 24 hr capsule Take 120 mg by mouth daily.   Yes Historical Provider, MD  doxycycline (ADOXA) 100 MG tablet Take 100 mg by mouth daily as needed (for traveling).  10/25/15  Yes Historical Provider, MD  amoxicillin-clavulanate (AUGMENTIN) 875-125 MG tablet Take 1 tablet by mouth 2 (two) times daily. Patient not taking: Reported on 11/20/2015 06/28/15   Shon Batonourtney F Horton, MD  clonazePAM (KLONOPIN) 0.5 MG tablet Take 0.25-0.5 mg by mouth 3 (three) times daily as needed for anxiety.  06/08/15   Historical Provider, MD  enalapril  (VASOTEC) 5 MG tablet Take 5 mg by mouth daily.    Historical Provider, MD  fluticasone (FLONASE) 50 MCG/ACT nasal spray Place 2 sprays into both nostrils daily. Patient taking differently: Place 2 sprays into both nostrils daily as needed for allergies.  06/28/15   Shon Batonourtney F Horton, MD  ibuprofen (ADVIL,MOTRIN) 200 MG tablet Take 200 mg by mouth every 6 (six) hours as needed for headache or mild pain.    Historical Provider, MD  meclizine (ANTIVERT) 50 MG tablet Take 1 tablet (50 mg total) by mouth 3 (three) times daily as needed for dizziness. Patient not taking: Reported on 11/20/2015 06/28/15   Shon Batonourtney F Horton, MD  nitrofurantoin, macrocrystal-monohydrate, (MACROBID) 100 MG capsule Take 100 mg by mouth 2 (two) times daily. 11/17/15   Historical Provider, MD  oxymetazoline (AFRIN NASAL SPRAY) 0.05 % nasal spray Place 1 spray into both nostrils 2 (two) times daily. 01/15/16   Alene MiresJennifer C Omohundro, NP   Meds Ordered and Administered this Visit  Medications - No data to display  BP 91/58 (BP Location: Left Arm)   Pulse 101   Temp 98.6 F (37 C) (Oral)   Resp 18   SpO2 100%  No data found.   Physical Exam  Constitutional: She appears well-developed and well-nourished.  HENT:  Congestion noted to bilateral nares. Post nasal drip noted to back of throat.   Cardiovascular: Normal rate and regular rhythm.   Pulmonary/Chest: Effort normal and breath  sounds normal.    Urgent Care Course   Clinical Course    Procedures (including critical care time)  Labs Review Labs Reviewed - No data to display  Imaging Review No results found.   Visual Acuity Review  Right Eye Distance:   Left Eye Distance:   Bilateral Distance:    Right Eye Near:   Left Eye Near:    Bilateral Near:         MDM   1. URI (upper respiratory infection)        Alene MiresJennifer C Omohundro, NP 01/15/16 1952

## 2016-01-19 ENCOUNTER — Encounter (HOSPITAL_COMMUNITY): Payer: Self-pay

## 2016-01-19 ENCOUNTER — Encounter (HOSPITAL_COMMUNITY): Payer: Self-pay | Admitting: *Deleted

## 2016-01-19 ENCOUNTER — Ambulatory Visit (HOSPITAL_COMMUNITY)
Admission: EM | Admit: 2016-01-19 | Discharge: 2016-01-19 | Disposition: A | Discharge status: Other Acute Inpatient Hospital | Payer: BLUE CROSS/BLUE SHIELD

## 2016-01-19 ENCOUNTER — Emergency Department (HOSPITAL_COMMUNITY): Payer: BLUE CROSS/BLUE SHIELD

## 2016-01-19 ENCOUNTER — Emergency Department (HOSPITAL_COMMUNITY)
Admission: EM | Admit: 2016-01-19 | Discharge: 2016-01-20 | Disposition: A | Discharge status: Home / Self Care | Payer: BLUE CROSS/BLUE SHIELD | Attending: Emergency Medicine | Admitting: Emergency Medicine

## 2016-01-19 DIAGNOSIS — Z91040 Latex allergy status: Secondary | ICD-10-CM | POA: Insufficient documentation

## 2016-01-19 DIAGNOSIS — I10 Essential (primary) hypertension: Secondary | ICD-10-CM | POA: Insufficient documentation

## 2016-01-19 DIAGNOSIS — J069 Acute upper respiratory infection, unspecified: Secondary | ICD-10-CM | POA: Diagnosis not present

## 2016-01-19 DIAGNOSIS — Z79899 Other long term (current) drug therapy: Secondary | ICD-10-CM | POA: Diagnosis not present

## 2016-01-19 DIAGNOSIS — R509 Fever, unspecified: Secondary | ICD-10-CM

## 2016-01-19 DIAGNOSIS — J029 Acute pharyngitis, unspecified: Secondary | ICD-10-CM | POA: Diagnosis present

## 2016-01-19 LAB — COMPREHENSIVE METABOLIC PANEL
ALBUMIN: 3.7 g/dL (ref 3.5–5.0)
ALK PHOS: 53 U/L (ref 38–126)
ALT: 31 U/L (ref 14–54)
ANION GAP: 7 (ref 5–15)
AST: 35 U/L (ref 15–41)
BILIRUBIN TOTAL: 0.5 mg/dL (ref 0.3–1.2)
BUN: 11 mg/dL (ref 6–20)
CALCIUM: 9.6 mg/dL (ref 8.9–10.3)
CO2: 30 mmol/L (ref 22–32)
Chloride: 102 mmol/L (ref 101–111)
Creatinine, Ser: 0.87 mg/dL (ref 0.44–1.00)
GFR calc non Af Amer: >60 mL/min (ref >60)
GLUCOSE: 118 mg/dL — AB (ref 65–99)
POTASSIUM: 3.7 mmol/L (ref 3.5–5.1)
SODIUM: 139 mmol/L (ref 135–145)
TOTAL PROTEIN: 7.5 g/dL (ref 6.5–8.1)

## 2016-01-19 LAB — CBC WITH DIFFERENTIAL/PLATELET
Basophils Absolute: 0 10*3/uL (ref 0.0–0.1)
Basophils Relative: 1 %
EOS ABS: 0.1 10*3/uL (ref 0.0–0.7)
EOS PCT: 2 %
HCT: 38.5 % (ref 36.0–46.0)
Hemoglobin: 12 g/dL (ref 12.0–15.0)
LYMPHS ABS: 2.5 10*3/uL (ref 0.7–4.0)
Lymphocytes Relative: 59 %
MCH: 28.1 pg (ref 26.0–34.0)
MCHC: 31.2 g/dL (ref 30.0–36.0)
MCV: 90.2 fL (ref 78.0–100.0)
MONOS PCT: 7 %
Monocytes Absolute: 0.3 10*3/uL (ref 0.1–1.0)
NEUTROS PCT: 31 %
Neutro Abs: 1.3 10*3/uL — ABNORMAL LOW (ref 1.7–7.7)
PLATELETS: 264 10*3/uL (ref 150–400)
RBC: 4.27 MIL/uL (ref 3.87–5.11)
RDW: 14.3 % (ref 11.5–15.5)
WBC: 4.2 10*3/uL (ref 4.0–10.5)

## 2016-01-19 MED ORDER — OMEPRAZOLE 20 MG PO CPDR: 20.0000 mg | DELAYED_RELEASE_CAPSULE | Freq: Every day | ORAL | 0 refills | Status: DC

## 2016-01-19 MED ORDER — BENZONATATE 100 MG PO CAPS: 100.0000 mg | ORAL_CAPSULE | Freq: Three times a day (TID) | ORAL | 0 refills | Status: DC

## 2016-01-19 NOTE — ED Notes (Signed)
Report  Phoned  To    Nurse  First      

## 2016-01-19 NOTE — ED Triage Notes (Signed)
Pt  Reports    Feelings    Of   Chills        Body   Aches     Fever       With  Some   Congestion      Seen     [facility]  4  Days  Ago for  [place]    Pt  Recent  Travel  From  [place]         Taking doxy  For  maleria  Prevention             Pt  Sitting  Upright on  Exam table  Speaking  In  Complete  sentances

## 2016-01-19 NOTE — ED Provider Notes (Signed)
[facility] Provider Note   CSN: [id number] Arrival date & time: 01/19/16  1606     History   Chief Complaint Chief Complaint  Patient presents with  . Generalized Body Aches  . Recurrent Sinusitis    HPI Theresa Bradford is a 55 y.o. female.  HPI Pt started having trouble with sore throat and nasal congestion.  The symptoms started earlier in the week.   She has had cough and congestion.   She has had myalgias and some sinus pain.  She also started having soreness in the chest area.  She did recently travel to [place].  She is currently on the doxycyline for malaria prevention.    The chest discomfort has been a burning sensation ever since she has been on the docycycline.  No relief with milk of magnesia  She went to an urgent care and was sent to the ED. Past Medical History:  Diagnosis Date  . Hypertension     There are no active problems to display for this patient.   Past Surgical History:  Procedure Laterality Date  . CESAREAN SECTION    . tummy tuck      OB History    No data available       Home Medications    Prior to Admission medications   Medication Sig Start Date End Date Taking? Authorizing Provider  benzonatate (TESSALON) 100 MG capsule Take 1 capsule (100 mg total) by mouth every 8 (eight) hours. 01/19/16   Linwood Dibbles, MD  clonazePAM (KLONOPIN) 0.5 MG tablet Take 0.25-0.5 mg by mouth 3 (three) times daily as needed for anxiety.  06/08/15   Historical Provider, MD  diltiazem (DILACOR XR) 120 MG 24 hr capsule Take 120 mg by mouth daily.    Historical Provider, MD  doxycycline (ADOXA) 100 MG tablet Take 100 mg by mouth daily as needed (for traveling).  10/25/15   Historical Provider, MD  enalapril (VASOTEC) 5 MG tablet Take 5 mg by mouth daily.    Historical Provider, MD  fluticasone (FLONASE) 50 MCG/ACT nasal spray Place 2 sprays into both nostrils daily. Patient taking differently: Place 2 sprays into both nostrils daily as needed for allergies.   06/28/15   Shon Baton, MD  ibuprofen (ADVIL,MOTRIN) 200 MG tablet Take 200 mg by mouth every 6 (six) hours as needed for headache or mild pain.    Historical Provider, MD  meclizine (ANTIVERT) 50 MG tablet Take 1 tablet (50 mg total) by mouth 3 (three) times daily as needed for dizziness. Patient not taking: Reported on 11/20/2015 06/28/15   Shon Baton, MD  nitrofurantoin, macrocrystal-monohydrate, (MACROBID) 100 MG capsule Take 100 mg by mouth 2 (two) times daily. 11/17/15   Historical Provider, MD  omeprazole (PRILOSEC) 20 MG capsule Take 1 capsule (20 mg total) by mouth daily. 01/19/16 02/02/16  Linwood Dibbles, MD  oxymetazoline (AFRIN NASAL SPRAY) 0.05 % nasal spray Place 1 spray into both nostrils 2 (two) times daily. 01/15/16   Alene Mires, NP    Family History History reviewed. No pertinent family history.  Social History Social History  Substance Use Topics  . Smoking status: Never Smoker  . Smokeless tobacco: Never Used  . Alcohol use No     Allergies   Other and Latex   Review of Systems Review of Systems   Physical Exam Updated Vital Signs BP 134/82   Pulse 77   Temp 98.4 F (36.9 C) (Oral)   Resp 13   Ht 5'  4" (1.626 m)   Wt 79.4 kg   SpO2 100%   BMI 30.04 kg/m   Physical Exam  Constitutional: She appears well-developed and well-nourished. No distress.  HENT:  Head: Normocephalic and atraumatic.  Right Ear: External ear normal.  Left Ear: External ear normal.  Mouth/Throat: No oropharyngeal exudate.  Eyes: Conjunctivae are normal. Right eye exhibits no discharge. Left eye exhibits no discharge. No scleral icterus.  Neck: Neck supple. No tracheal deviation present.  Cardiovascular: Normal rate, regular rhythm and intact distal pulses.   Pulmonary/Chest: Effort normal and breath sounds normal. No stridor. No respiratory distress. She has no wheezes. She has no rales.  Abdominal: Soft. Bowel sounds are normal. She exhibits no distension. There is  no tenderness. There is no rebound and no guarding.  Musculoskeletal: She exhibits no edema or tenderness.  Neurological: She is alert. She has normal strength. No cranial nerve deficit (no facial droop, extraocular movements intact, no slurred speech) or sensory deficit. She exhibits normal muscle tone. She displays no seizure activity. Coordination normal.  Skin: Skin is warm and dry. No rash noted.  Psychiatric: She has a normal mood and affect.  Nursing note and vitals reviewed.    ED Treatments / Results  Labs (all labs ordered are listed, but only abnormal results are displayed) Labs Reviewed  COMPREHENSIVE METABOLIC PANEL - Abnormal; Notable for the following:       Result Value   Glucose, Bld 118 (*)    All other components within normal limits  CBC WITH DIFFERENTIAL/PLATELET - Abnormal; Notable for the following:    Neutro Abs 1.3 (*)    All other components within normal limits    EKG  EKG Interpretation  Date/Time:  Friday January 19 2016 22:24:47 EDT Ventricular Rate:  79 PR Interval:    QRS Duration: 89 QT Interval:  378 QTC Calculation: 434 R Axis:   65 Text Interpretation:  Sinus rhythm Abnormal R-wave progression, early transition Since last tracing rate slower Confirmed by Ibtisam Benge  MD-J, Terrika Zuver ([id number]) on 01/19/2016 10:28:32 PM       Radiology Dg Chest 2 View  Result Date: 01/19/2016 CLINICAL DATA:  Cough and congestion since [place] from [place] EXAM: CHEST  2 VIEW COMPARISON:  Chest radiograph 11/20/2015 FINDINGS: Cardiomediastinal contours are normal. No pneumothorax or sizable pleural effusion. No focal airspace consolidation or pulmonary edema. IMPRESSION: Clear lungs. Electronically Signed   By: Deatra RobinsonKevin  Herman M.D.   On: 01/19/2016 23:35    Procedures Procedures (including critical care time)  Medications Ordered in ED Medications - No data to display   Initial Impression / Assessment and Plan / ED Course  I have reviewed the triage vital signs and  the nursing notes.  Pertinent labs & imaging results that were available during my care of the patient were reviewed by me and considered in my medical decision making (see chart for details).  Clinical Course  Value Comment By Time  [facility]: 28.1 (Reviewed) Linwood DibblesJon Maison Agrusa, MD 08/25 2220  Glucose: (!) 118 (Reviewed) Linwood DibblesJon Aloys Hupfer, MD 08/25 2337   Labs reviewed.  Increased glucose otherwise normal.   Linwood DibblesJon Jacarri Gesner, MD 08/25 2338   Doubt acute malaria.  Initial labs are normal. CXR normal.  Pt is not having any fevers.  Sx are suggestive of URI process.    Final Clinical Impressions(s) / ED Diagnoses   Final diagnoses:  URI (upper respiratory infection)    New Prescriptions New Prescriptions   BENZONATATE (TESSALON) 100 MG CAPSULE  Take 1 capsule (100 mg total) by mouth every 8 (eight) hours.   OMEPRAZOLE (PRILOSEC) 20 MG CAPSULE    Take 1 capsule (20 mg total) by mouth daily.     Linwood Dibbles, MD 01/19/16 [phone]

## 2016-01-19 NOTE — ED Notes (Signed)
Nurse   First     Phoned      Given  report

## 2016-01-19 NOTE — ED Notes (Signed)
Patient transported to X-ray 

## 2016-01-19 NOTE — ED Triage Notes (Signed)
Pt sent here from [facility], she was seen there for ongoing sinus infection that started on Saturday. Pt recently [place] and UC wanted her to come here to be tested for Malaria.

## 2016-01-19 NOTE — ED Notes (Signed)
Pt now complaining of chest pain 

## 2016-01-19 NOTE — ED Provider Notes (Addendum)
[facility]    CSN: [id number] Arrival date & time: 01/19/16  1350  First Provider Contact:  First MD Initiated Contact with Patient 01/19/16 1437        History   Chief Complaint Chief Complaint  Patient presents with  . URI    HPI Theresa Bradford is a 55 y.o. female.   The history is provided by the patient.  URI  Presenting symptoms: congestion, cough, fever and rhinorrhea   Severity:  Moderate Onset quality:  Gradual Duration:  6 days Progression:  Waxing and waning Chronicity:  New Relieved by:  Nothing Associated symptoms: myalgias and sinus pain   Associated symptoms: no wheezing   Risk factors: recent travel     Past Medical History:  Diagnosis Date  . Hypertension     There are no active problems to display for this patient.   Past Surgical History:  Procedure Laterality Date  . CESAREAN SECTION    . tummy tuck      OB History    No data available       Home Medications    Prior to Admission medications   Medication Sig Start Date End Date Taking? Authorizing Provider  amoxicillin-clavulanate (AUGMENTIN) 875-125 MG tablet Take 1 tablet by mouth 2 (two) times daily. Patient not taking: Reported on 11/20/2015 06/28/15   Shon Baton, MD  clonazePAM (KLONOPIN) 0.5 MG tablet Take 0.25-0.5 mg by mouth 3 (three) times daily as needed for anxiety.  06/08/15   Historical Provider, MD  diltiazem (DILACOR XR) 120 MG 24 hr capsule Take 120 mg by mouth daily.    Historical Provider, MD  doxycycline (ADOXA) 100 MG tablet Take 100 mg by mouth daily as needed (for traveling).  10/25/15   Historical Provider, MD  enalapril (VASOTEC) 5 MG tablet Take 5 mg by mouth daily.    Historical Provider, MD  fluticasone (FLONASE) 50 MCG/ACT nasal spray Place 2 sprays into both nostrils daily. Patient taking differently: Place 2 sprays into both nostrils daily as needed for allergies.  06/28/15   Shon Baton, MD  ibuprofen (ADVIL,MOTRIN) 200 MG tablet Take  200 mg by mouth every 6 (six) hours as needed for headache or mild pain.    Historical Provider, MD  meclizine (ANTIVERT) 50 MG tablet Take 1 tablet (50 mg total) by mouth 3 (three) times daily as needed for dizziness. Patient not taking: Reported on 11/20/2015 06/28/15   Shon Baton, MD  nitrofurantoin, macrocrystal-monohydrate, (MACROBID) 100 MG capsule Take 100 mg by mouth 2 (two) times daily. 11/17/15   Historical Provider, MD  oxymetazoline (AFRIN NASAL SPRAY) 0.05 % nasal spray Place 1 spray into both nostrils 2 (two) times daily. 01/15/16   Alene Mires, NP    Family History No family history on file.  Social History Social History  Substance Use Topics  . Smoking status: Never Smoker  . Smokeless tobacco: Never Used  . Alcohol use No     Allergies   Other and Latex   Review of Systems Review of Systems  Constitutional: Positive for fever.  HENT: Positive for congestion and rhinorrhea.   Respiratory: Positive for cough. Negative for wheezing.   Cardiovascular: Negative.   Gastrointestinal: Negative.   Genitourinary: Negative.   Musculoskeletal: Positive for myalgias.  All other systems reviewed and are negative.    Physical Exam Triage Vital Signs ED Triage Vitals [01/19/16 1452]  Enc Vitals Group     BP 115/59     Pulse  Rate 84     Resp 12     Temp 99.1 F (37.3 C)     Temp Source Oral     SpO2 100 %     Weight      Height      Head Circumference      Peak Flow      Pain Score      Pain Loc      Pain Edu?      Excl. in GC?    No data found.   Updated Vital Signs BP 115/59 (BP Location: Left Arm)   Pulse 84   Temp 99.1 F (37.3 C) (Oral)   Resp 12   SpO2 100%   Visual Acuity Right Eye Distance:   Left Eye Distance:   Bilateral Distance:    Right Eye Near:   Left Eye Near:    Bilateral Near:     Physical Exam  Constitutional: She appears well-developed and well-nourished. No distress.  HENT:  Mouth/Throat: Oropharynx is  clear and moist. No oropharyngeal exudate.  Eyes: Conjunctivae and EOM are normal. Pupils are equal, round, and reactive to light.  Neck: Normal range of motion.  Cardiovascular: Normal rate, regular rhythm, normal heart sounds and intact distal pulses.   Pulmonary/Chest: Effort normal and breath sounds normal.  Abdominal: Soft. Bowel sounds are normal.  Lymphadenopathy:    She has no cervical adenopathy.  Neurological: She is alert.  Skin: Skin is warm and dry.  Nursing note and vitals reviewed.    UC Treatments / Results  Labs (all labs ordered are listed, but only abnormal results are displayed) Labs Reviewed - No data to display  EKG  EKG Interpretation None       Radiology No results found.  Procedures Procedures (including critical care time)  Medications Ordered in UC Medications - No data to display   Initial Impression / Assessment and Plan / UC Course  I have reviewed the triage vital signs and the nursing notes.  Pertinent labs & imaging results that were available during my care of the patient were reviewed by me and considered in my medical decision making (see chart for details).  Clinical Course  sent for eval of poss [place] illness- [place], onset last sun , returned sat. On doxy for malaria prophylaxis.   Final Clinical Impressions(s) / UC Diagnoses   Final diagnoses:  None    New Prescriptions New Prescriptions   No medications on file     Linna HoffJames D Jisele Price, MD 01/19/16 1505    Linna HoffJames D Sharice Harriss, MD 01/19/16 [phone]

## 2016-02-09 ENCOUNTER — Ambulatory Visit (HOSPITAL_COMMUNITY)
Admission: EM | Admit: 2016-02-09 | Discharge: 2016-02-09 | Disposition: A | Discharge status: Home / Self Care | Payer: BLUE CROSS/BLUE SHIELD | Attending: Family Medicine | Admitting: Family Medicine

## 2016-02-09 ENCOUNTER — Encounter (HOSPITAL_COMMUNITY): Payer: Self-pay | Admitting: *Deleted

## 2016-02-09 DIAGNOSIS — J324 Chronic pansinusitis: Secondary | ICD-10-CM

## 2016-02-09 HISTORY — DX: Chronic sinusitis, unspecified: J32.9

## 2016-02-09 MED ORDER — FLUCONAZOLE 150 MG PO TABS: 150.0000 mg | ORAL_TABLET | Freq: Every day | ORAL | 0 refills | Status: DC

## 2016-02-09 MED ORDER — LEVOFLOXACIN 500 MG PO TABS: 500.0000 mg | ORAL_TABLET | Freq: Every day | ORAL | 0 refills | Status: AC

## 2016-02-09 NOTE — ED Provider Notes (Signed)
CSN: [id number]     Arrival date & time 02/09/16  1813 History   None    Chief Complaint  Patient presents with  . Facial Pain   (Consider location/radiation/quality/duration/timing/severity/associated sxs/prior Treatment) 55 year old female presents with nasal congestion, sinus pain and pressure for over 3 weeks. No longer having fevers or cough. Has history of recurrent sinus infections every 2 months. Has seen ENT and Allergist with minimal success. Has taken Augmentin, Doxycycline within the past 2 months. Has done well on Levaquin in the past. She was first seen on 8/21 here and dx with URI and given Afrin. She returned 4 days later (8/25) to Urgent Care and was sent to the ER due to recent travel and already on Doxycycline. They performed additional testing and gave her Tessalon perles and Prilosec for cough. Now sinus symptoms persist and are worsening.     The history is provided by the patient.    Past Medical History:  Diagnosis Date  . Frequent sinus infections   . Hypertension    Past Surgical History:  Procedure Laterality Date  . CESAREAN SECTION    . tummy tuck     No family history on file. Social History  Substance Use Topics  . Smoking status: Never Smoker  . Smokeless tobacco: Never Used  . Alcohol use No   OB History    No data available     Review of Systems  Constitutional: Positive for fatigue. Negative for chills and fever.  HENT: Positive for congestion, rhinorrhea and sinus pressure. Negative for ear pain and sore throat.   Eyes: Negative for discharge.  Respiratory: Negative for cough, chest tightness and shortness of breath.   Gastrointestinal: Negative for abdominal pain, diarrhea, nausea and vomiting.  Musculoskeletal: Negative for neck pain and neck stiffness.  Skin: Negative for rash.  Neurological: Positive for headaches. Negative for dizziness and weakness.  Hematological: Negative for adenopathy.    Allergies  Other and Latex  Home  Medications   Prior to Admission medications   Medication Sig Start Date End Date Taking? Authorizing Provider  clonazePAM (KLONOPIN) 0.5 MG tablet Take 0.25-0.5 mg by mouth 3 (three) times daily as needed for anxiety.  06/08/15  Yes Historical Provider, MD  diltiazem (DILACOR XR) 120 MG 24 hr capsule Take 120 mg by mouth daily.   Yes Historical Provider, MD  fluconazole (DIFLUCAN) 150 MG tablet Take 1 tablet (150 mg total) by mouth daily. May repeat in 4 days if needed 02/09/16   Sudie Grumbling, NP  fluticasone [facility]) 50 MCG/ACT nasal spray Place 2 sprays into both nostrils daily. Patient taking differently: Place 2 sprays into both nostrils daily as needed for allergies.  06/28/15   Shon Baton, MD  ibuprofen (ADVIL,MOTRIN) 200 MG tablet Take 200 mg by mouth every 6 (six) hours as needed for headache or mild pain.    Historical Provider, MD  levofloxacin (LEVAQUIN) 500 MG tablet Take 1 tablet (500 mg total) by mouth daily. 02/09/16 02/16/16  Sudie Grumbling, NP  omeprazole (PRILOSEC) 20 MG capsule Take 1 capsule (20 mg total) by mouth daily. 01/19/16 02/02/16  Linwood Dibbles, MD   Meds Ordered and Administered this Visit  Medications - No data to display  BP 133/76 (BP Location: Left Arm)   Pulse 80   Temp 98.6 F (37 C) (Oral)   Resp 16   SpO2 100%  No data found.   Physical Exam  Constitutional: She is oriented to person, place,  and time. She appears well-developed and well-nourished. No distress.  HENT:  Head: Normocephalic and atraumatic.  Right Ear: Hearing, tympanic membrane, external ear and ear canal normal.  Left Ear: Hearing, tympanic membrane, external ear and ear canal normal.  Nose: Mucosal edema, rhinorrhea and sinus tenderness present. Right sinus exhibits maxillary sinus tenderness and frontal sinus tenderness. Left sinus exhibits maxillary sinus tenderness and frontal sinus tenderness.  Mouth/Throat: Uvula is midline and mucous membranes are normal. Oropharyngeal  exudate (slight yellow post nasal drainage present) present.  Neck: Normal range of motion. Neck supple.  Cardiovascular: Normal rate, regular rhythm and normal heart sounds.   Pulmonary/Chest: Effort normal and breath sounds normal. She has no wheezes.  Lymphadenopathy:    She has no cervical adenopathy.  Neurological: She is alert and oriented to person, place, and time.  Skin: Skin is warm and dry.  Psychiatric: She has a normal mood and affect. Her behavior is normal. Judgment and thought content normal.    Urgent Care Course   Clinical Course    Procedures (including critical care time)  Labs Review Labs Reviewed - No data to display  Imaging Review No results found.   Visual Acuity Review  Right Eye Distance:   Left Eye Distance:   Bilateral Distance:    Right Eye Near:   Left Eye Near:    Bilateral Near:         MDM   1. Chronic pansinusitis    Recommend Levaquin daily as directed. May take Diflucan 150mg  as needed if yeast vaginitis develop from antibiotic use. Continue Flonase. D/C Afrin. Recommend patient follow-up with her ENT for further evaluation and treatment of recurrent, chronic sinus issues.     Sudie GrumblingAnn Berry Aritzel Krusemark, NP 02/10/16 1228

## 2016-02-09 NOTE — Discharge Instructions (Signed)
Start Levaquin once daily as directed. Take Diflucan on 3rd day of antibiotic to prevent yeast vaginitis. Recommend Ibuprofen as needed for headache and pain. Follow-up with your ENT or Allergist for further evaluation of recurrent sinus infections.

## 2016-02-09 NOTE — ED Triage Notes (Signed)
C/O facial pain and pressure with headache and feeling chills with low-grade fevers.  [facility] - was sent to ED since pt was already on doxycycline and had just returned from being out of the country.  Pt continues with sinus sxs, cough resolved.

## 2016-06-20 ENCOUNTER — Ambulatory Visit: Payer: Self-pay | Admitting: Allergy and Immunology

## 2016-07-28 ENCOUNTER — Encounter (HOSPITAL_COMMUNITY)
Admission: EM | Disposition: A | Discharge status: Home / Self Care | Payer: Self-pay | Source: Home / Self Care | Attending: Emergency Medicine

## 2016-07-28 ENCOUNTER — Observation Stay (HOSPITAL_COMMUNITY)
Admission: EM | Admit: 2016-07-28 | Discharge: 2016-07-31 | Disposition: A | Discharge status: Home / Self Care | Payer: 59 | Attending: Surgery | Admitting: Surgery

## 2016-07-28 ENCOUNTER — Observation Stay (HOSPITAL_COMMUNITY): Payer: 59 | Admitting: Anesthesiology

## 2016-07-28 ENCOUNTER — Encounter (HOSPITAL_COMMUNITY): Payer: Self-pay | Admitting: Emergency Medicine

## 2016-07-28 ENCOUNTER — Emergency Department (HOSPITAL_COMMUNITY): Payer: 59

## 2016-07-28 DIAGNOSIS — Z79899 Other long term (current) drug therapy: Secondary | ICD-10-CM | POA: Insufficient documentation

## 2016-07-28 DIAGNOSIS — I10 Essential (primary) hypertension: Secondary | ICD-10-CM | POA: Diagnosis not present

## 2016-07-28 DIAGNOSIS — J302 Other seasonal allergic rhinitis: Secondary | ICD-10-CM | POA: Insufficient documentation

## 2016-07-28 DIAGNOSIS — K3580 Unspecified acute appendicitis: Secondary | ICD-10-CM | POA: Diagnosis not present

## 2016-07-28 DIAGNOSIS — K37 Unspecified appendicitis: Secondary | ICD-10-CM | POA: Diagnosis present

## 2016-07-28 DIAGNOSIS — F419 Anxiety disorder, unspecified: Secondary | ICD-10-CM | POA: Insufficient documentation

## 2016-07-28 DIAGNOSIS — R109 Unspecified abdominal pain: Secondary | ICD-10-CM | POA: Diagnosis present

## 2016-07-28 HISTORY — PX: LAPAROSCOPIC APPENDECTOMY: SHX408

## 2016-07-28 LAB — COMPREHENSIVE METABOLIC PANEL
ALBUMIN: 4 g/dL (ref 3.5–5.0)
ALK PHOS: 45 U/L (ref 38–126)
ALT: 13 U/L — AB (ref 14–54)
AST: 18 U/L (ref 15–41)
Anion gap: 6 (ref 5–15)
BILIRUBIN TOTAL: 0.5 mg/dL (ref 0.3–1.2)
BUN: 6 mg/dL (ref 6–20)
CALCIUM: 9.5 mg/dL (ref 8.9–10.3)
CO2: 27 mmol/L (ref 22–32)
CREATININE: 0.77 mg/dL (ref 0.44–1.00)
Chloride: 105 mmol/L (ref 101–111)
GFR calc Af Amer: >60 mL/min (ref >60)
GLUCOSE: 126 mg/dL — AB (ref 65–99)
Potassium: 3.8 mmol/L (ref 3.5–5.1)
Sodium: 138 mmol/L (ref 135–145)
TOTAL PROTEIN: 7.6 g/dL (ref 6.5–8.1)

## 2016-07-28 LAB — LIPASE, BLOOD: Lipase: 15 U/L (ref 11–51)

## 2016-07-28 LAB — CBC
HCT: 40.1 % (ref 36.0–46.0)
Hemoglobin: 12.9 g/dL (ref 12.0–15.0)
MCH: 28.4 pg (ref 26.0–34.0)
MCHC: 32.2 g/dL (ref 30.0–36.0)
MCV: 88.3 fL (ref 78.0–100.0)
PLATELETS: 294 10*3/uL (ref 150–400)
RBC: 4.54 MIL/uL (ref 3.87–5.11)
RDW: 13.6 % (ref 11.5–15.5)
WBC: 10.1 10*3/uL (ref 4.0–10.5)

## 2016-07-28 LAB — URINALYSIS, ROUTINE W REFLEX MICROSCOPIC
BILIRUBIN URINE: NEGATIVE
GLUCOSE, UA: NEGATIVE mg/dL
KETONES UR: NEGATIVE mg/dL
LEUKOCYTES UA: NEGATIVE
NITRITE: NEGATIVE
PH: 7 (ref 5.0–8.0)
Protein, ur: NEGATIVE mg/dL
SPECIFIC GRAVITY, URINE: 1.002 — AB (ref 1.005–1.030)

## 2016-07-28 SURGERY — APPENDECTOMY, LAPAROSCOPIC
Anesthesia: General | Site: Abdomen

## 2016-07-28 MED ORDER — SUGAMMADEX SODIUM 200 MG/2ML IV SOLN
INTRAVENOUS | Status: DC | PRN
  Administered 2016-07-28: 200 mg via INTRAVENOUS

## 2016-07-28 MED ORDER — SUFENTANIL CITRATE 50 MCG/ML IV SOLN
INTRAVENOUS | Status: AC
  Filled 2016-07-28: qty 1

## 2016-07-28 MED ORDER — OXYCODONE HCL 5 MG PO TABS
5.0000 mg | ORAL_TABLET | ORAL | Status: DC | PRN
  Administered 2016-07-29 – 2016-07-30 (×6): 10 mg via ORAL
  Filled 2016-07-28 (×6): qty 2

## 2016-07-28 MED ORDER — SODIUM CHLORIDE 0.9 % IV SOLN: INTRAVENOUS | Status: DC

## 2016-07-28 MED ORDER — DEXAMETHASONE SODIUM PHOSPHATE 10 MG/ML IJ SOLN
INTRAMUSCULAR | Status: DC | PRN
  Administered 2016-07-28: 8 mg via INTRAVENOUS

## 2016-07-28 MED ORDER — PROPOFOL 10 MG/ML IV BOLUS
INTRAVENOUS | Status: DC | PRN
  Administered 2016-07-28: 180 mg via INTRAVENOUS

## 2016-07-28 MED ORDER — LACTATED RINGERS IV SOLN
INTRAVENOUS | Status: DC | PRN
  Administered 2016-07-28 (×2): via INTRAVENOUS

## 2016-07-28 MED ORDER — IOPAMIDOL (ISOVUE-300) INJECTION 61%
INTRAVENOUS | Status: AC
  Administered 2016-07-28: 100 mL
  Filled 2016-07-28: qty 100

## 2016-07-28 MED ORDER — ONDANSETRON HCL 4 MG/2ML IJ SOLN: 4.0000 mg | Freq: Four times a day (QID) | INTRAMUSCULAR | Status: DC | PRN

## 2016-07-28 MED ORDER — HYDROMORPHONE HCL 1 MG/ML IJ SOLN
INTRAMUSCULAR | Status: AC
  Administered 2016-07-28: 0.5 mg via INTRAVENOUS
  Filled 2016-07-28: qty 1

## 2016-07-28 MED ORDER — CLONAZEPAM 0.5 MG PO TABS
0.2500 mg | ORAL_TABLET | Freq: Three times a day (TID) | ORAL | Status: DC | PRN
  Filled 2016-07-28: qty 1

## 2016-07-28 MED ORDER — SODIUM CHLORIDE 0.9 % IV SOLN
INTRAVENOUS | Status: DC
  Administered 2016-07-28: via INTRAVENOUS

## 2016-07-28 MED ORDER — SODIUM CHLORIDE 0.9 % IR SOLN
Status: DC | PRN
  Administered 2016-07-28: 1000 mL

## 2016-07-28 MED ORDER — MIDAZOLAM HCL 2 MG/2ML IJ SOLN
INTRAMUSCULAR | Status: AC
  Filled 2016-07-28: qty 2

## 2016-07-28 MED ORDER — ROCURONIUM BROMIDE 50 MG/5ML IV SOSY
PREFILLED_SYRINGE | INTRAVENOUS | Status: AC
  Filled 2016-07-28: qty 5

## 2016-07-28 MED ORDER — MONTELUKAST SODIUM 10 MG PO TABS
10.0000 mg | ORAL_TABLET | Freq: Every day | ORAL | Status: DC
  Administered 2016-07-28 – 2016-07-30 (×3): 10 mg via ORAL
  Filled 2016-07-28 (×3): qty 1

## 2016-07-28 MED ORDER — SCOPOLAMINE 1 MG/3DAYS TD PT72
MEDICATED_PATCH | TRANSDERMAL | Status: DC | PRN
  Administered 2016-07-28: 1 via TRANSDERMAL

## 2016-07-28 MED ORDER — DOCUSATE SODIUM 100 MG PO CAPS
100.0000 mg | ORAL_CAPSULE | Freq: Two times a day (BID) | ORAL | Status: DC
  Administered 2016-07-29 – 2016-07-30 (×4): 100 mg via ORAL
  Filled 2016-07-28 (×5): qty 1

## 2016-07-28 MED ORDER — ACETAMINOPHEN 650 MG RE SUPP: 650.0000 mg | Freq: Four times a day (QID) | RECTAL | Status: DC | PRN

## 2016-07-28 MED ORDER — ONDANSETRON HCL 4 MG/2ML IJ SOLN
INTRAMUSCULAR | Status: AC
  Filled 2016-07-28: qty 2

## 2016-07-28 MED ORDER — SUCCINYLCHOLINE CHLORIDE 200 MG/10ML IV SOSY
PREFILLED_SYRINGE | INTRAVENOUS | Status: AC
  Filled 2016-07-28: qty 10

## 2016-07-28 MED ORDER — PIPERACILLIN-TAZOBACTAM 3.375 G IVPB 30 MIN
3.3750 g | Freq: Once | INTRAVENOUS | Status: AC
  Administered 2016-07-28: 3.375 g via INTRAVENOUS
  Filled 2016-07-28: qty 50

## 2016-07-28 MED ORDER — HYDROMORPHONE HCL 1 MG/ML IJ SOLN
0.2500 mg | INTRAMUSCULAR | Status: DC | PRN
  Administered 2016-07-28 (×3): 0.5 mg via INTRAVENOUS

## 2016-07-28 MED ORDER — NITROFURANTOIN MONOHYD MACRO 100 MG PO CAPS: 100.0000 mg | ORAL_CAPSULE | Freq: Two times a day (BID) | ORAL | Status: DC

## 2016-07-28 MED ORDER — SUGAMMADEX SODIUM 200 MG/2ML IV SOLN
INTRAVENOUS | Status: AC
  Filled 2016-07-28: qty 2

## 2016-07-28 MED ORDER — SUFENTANIL CITRATE 50 MCG/ML IV SOLN
INTRAVENOUS | Status: DC | PRN
  Administered 2016-07-28: 10 ug via INTRAVENOUS
  Administered 2016-07-28: 5 ug via INTRAVENOUS
  Administered 2016-07-28: 10 ug via INTRAVENOUS
  Administered 2016-07-28: 5 ug via INTRAVENOUS

## 2016-07-28 MED ORDER — SUCCINYLCHOLINE CHLORIDE 20 MG/ML IJ SOLN
INTRAMUSCULAR | Status: DC | PRN
Start: 2016-07-28 — End: 2016-07-28
  Administered 2016-07-28: 100 mg via INTRAVENOUS

## 2016-07-28 MED ORDER — DIPHENHYDRAMINE HCL 50 MG/ML IJ SOLN
12.5000 mg | Freq: Four times a day (QID) | INTRAMUSCULAR | Status: DC | PRN
Start: 2016-07-28 — End: 2016-07-31
  Administered 2016-07-28 – 2016-07-29 (×4): 12.5 mg via INTRAVENOUS
  Filled 2016-07-28 (×3): qty 1

## 2016-07-28 MED ORDER — ONDANSETRON HCL 4 MG/2ML IJ SOLN
INTRAMUSCULAR | Status: DC | PRN
  Administered 2016-07-28: 4 mg via INTRAVENOUS

## 2016-07-28 MED ORDER — OXYCODONE HCL 5 MG/5ML PO SOLN: 5.0000 mg | Freq: Once | ORAL | Status: DC | PRN

## 2016-07-28 MED ORDER — FLUTICASONE PROPIONATE 50 MCG/ACT NA SUSP: 2.0000 | Freq: Every day | NASAL | Status: DC | PRN

## 2016-07-28 MED ORDER — MIDAZOLAM HCL 2 MG/2ML IJ SOLN
INTRAMUSCULAR | Status: DC | PRN
  Administered 2016-07-28: 2 mg via INTRAVENOUS

## 2016-07-28 MED ORDER — HYDROMORPHONE HCL 1 MG/ML IJ SOLN
INTRAMUSCULAR | Status: AC
  Filled 2016-07-28: qty 0.5

## 2016-07-28 MED ORDER — HYDROMORPHONE HCL 2 MG/ML IJ SOLN: 0.5000 mg | INTRAMUSCULAR | Status: DC | PRN

## 2016-07-28 MED ORDER — MAGNESIUM HYDROXIDE 400 MG/5ML PO SUSP: 30.0000 mL | Freq: Every day | ORAL | Status: DC | PRN

## 2016-07-28 MED ORDER — OXYCODONE HCL 5 MG PO TABS: 5.0000 mg | ORAL_TABLET | Freq: Once | ORAL | Status: DC | PRN

## 2016-07-28 MED ORDER — BUPIVACAINE HCL (PF) 0.25 % IJ SOLN
INTRAMUSCULAR | Status: AC
  Filled 2016-07-28: qty 30

## 2016-07-28 MED ORDER — 0.9 % SODIUM CHLORIDE (POUR BTL) OPTIME
TOPICAL | Status: DC | PRN
  Administered 2016-07-28: 1000 mL

## 2016-07-28 MED ORDER — BISACODYL 10 MG RE SUPP: 10.0000 mg | Freq: Every day | RECTAL | Status: DC | PRN

## 2016-07-28 MED ORDER — SODIUM CHLORIDE 0.9 % IV BOLUS (SEPSIS)
1000.0000 mL | Freq: Once | INTRAVENOUS | Status: AC
  Administered 2016-07-28: 1000 mL via INTRAVENOUS

## 2016-07-28 MED ORDER — SODIUM CHLORIDE 0.9 % IJ SOLN
INTRAMUSCULAR | Status: AC
  Filled 2016-07-28: qty 10

## 2016-07-28 MED ORDER — ONDANSETRON 4 MG PO TBDP
4.0000 mg | ORAL_TABLET | Freq: Four times a day (QID) | ORAL | Status: DC | PRN
  Filled 2016-07-28: qty 1

## 2016-07-28 MED ORDER — ACETAMINOPHEN 325 MG PO TABS
650.0000 mg | ORAL_TABLET | Freq: Four times a day (QID) | ORAL | Status: DC | PRN
  Administered 2016-07-29 – 2016-07-30 (×3): 650 mg via ORAL
  Filled 2016-07-28 (×3): qty 2

## 2016-07-28 MED ORDER — BUPIVACAINE HCL 0.25 % IJ SOLN
INTRAMUSCULAR | Status: DC | PRN
  Administered 2016-07-28: 15 mL

## 2016-07-28 MED ORDER — DILTIAZEM HCL ER COATED BEADS 120 MG PO CP24
120.0000 mg | ORAL_CAPSULE | Freq: Every day | ORAL | Status: DC
  Administered 2016-07-29 – 2016-07-30 (×2): 120 mg via ORAL
  Filled 2016-07-28 (×2): qty 1

## 2016-07-28 MED ORDER — HYDRALAZINE HCL 20 MG/ML IJ SOLN: 10.0000 mg | INTRAMUSCULAR | Status: DC | PRN

## 2016-07-28 MED ORDER — LIDOCAINE HCL (CARDIAC) 20 MG/ML IV SOLN
INTRAVENOUS | Status: DC | PRN
  Administered 2016-07-28: 50 mg via INTRATRACHEAL

## 2016-07-28 MED ORDER — ROCURONIUM BROMIDE 100 MG/10ML IV SOLN
INTRAVENOUS | Status: DC | PRN
  Administered 2016-07-28: 50 mg via INTRAVENOUS

## 2016-07-28 MED ORDER — LIDOCAINE 2% (20 MG/ML) 5 ML SYRINGE
INTRAMUSCULAR | Status: AC
  Filled 2016-07-28: qty 5

## 2016-07-28 MED ORDER — DEXTROSE 5 % IV SOLN: 2.0000 g | INTRAVENOUS | Status: DC

## 2016-07-28 MED ORDER — DEXAMETHASONE SODIUM PHOSPHATE 10 MG/ML IJ SOLN
INTRAMUSCULAR | Status: AC
  Filled 2016-07-28: qty 1

## 2016-07-28 MED ORDER — METRONIDAZOLE IN NACL 5-0.79 MG/ML-% IV SOLN: 500.0000 mg | Freq: Three times a day (TID) | INTRAVENOUS | Status: DC

## 2016-07-28 MED ORDER — PROPOFOL 10 MG/ML IV BOLUS
INTRAVENOUS | Status: AC
  Filled 2016-07-28: qty 20

## 2016-07-28 MED ORDER — DIPHENHYDRAMINE HCL 12.5 MG/5ML PO ELIX
12.5000 mg | ORAL_SOLUTION | Freq: Four times a day (QID) | ORAL | Status: DC | PRN
  Administered 2016-07-30 (×3): 12.5 mg via ORAL
  Filled 2016-07-28 (×3): qty 10

## 2016-07-28 MED ORDER — KETOROLAC TROMETHAMINE 30 MG/ML IJ SOLN
INTRAMUSCULAR | Status: AC
  Filled 2016-07-28: qty 1

## 2016-07-28 MED ORDER — IBUPROFEN 400 MG PO TABS: 600.0000 mg | ORAL_TABLET | Freq: Four times a day (QID) | ORAL | Status: DC | PRN

## 2016-07-28 MED ORDER — SCOPOLAMINE 1 MG/3DAYS TD PT72
MEDICATED_PATCH | TRANSDERMAL | Status: AC
  Filled 2016-07-28: qty 1

## 2016-07-28 MED ORDER — ENOXAPARIN SODIUM 40 MG/0.4ML ~~LOC~~ SOLN
40.0000 mg | Freq: Every day | SUBCUTANEOUS | Status: DC
  Administered 2016-07-29 – 2016-07-30 (×2): 40 mg via SUBCUTANEOUS
  Filled 2016-07-28 (×2): qty 0.4

## 2016-07-28 MED ORDER — SENNA 8.6 MG PO TABS
1.0000 | ORAL_TABLET | Freq: Two times a day (BID) | ORAL | Status: DC
  Administered 2016-07-29 – 2016-07-30 (×4): 8.6 mg via ORAL
  Filled 2016-07-28 (×5): qty 1

## 2016-07-28 MED ORDER — ONDANSETRON HCL 4 MG/2ML IJ SOLN
4.0000 mg | Freq: Four times a day (QID) | INTRAMUSCULAR | Status: AC | PRN
  Administered 2016-07-28: 4 mg via INTRAVENOUS

## 2016-07-28 SURGICAL SUPPLY — 40 items
APPLIER CLIP 5 13 M/L LIGAMAX5 (MISCELLANEOUS)
BENZOIN TINCTURE PRP APPL 2/3 (GAUZE/BANDAGES/DRESSINGS) IMPLANT
BLADE CLIPPER SURG (BLADE) IMPLANT
CANISTER SUCT 3000ML PPV (MISCELLANEOUS) ×3 IMPLANT
CHLORAPREP W/TINT 26ML (MISCELLANEOUS) ×3 IMPLANT
CLIP APPLIE 5 13 M/L LIGAMAX5 (MISCELLANEOUS) IMPLANT
COVER SURGICAL LIGHT HANDLE (MISCELLANEOUS) ×3 IMPLANT
COVER TRANSDUCER ULTRASND (DRAPES) IMPLANT
DERMABOND ADVANCED (GAUZE/BANDAGES/DRESSINGS) ×2
DERMABOND ADVANCED .7 DNX12 (GAUZE/BANDAGES/DRESSINGS) ×1 IMPLANT
DEVICE TROCAR PUNCTURE CLOSURE (ENDOMECHANICALS) ×3 IMPLANT
ELECT REM PT RETURN 9FT ADLT (ELECTROSURGICAL) ×3
ELECTRODE REM PT RTRN 9FT ADLT (ELECTROSURGICAL) ×1 IMPLANT
ENDOLOOP SUT PDS II  0 18 (SUTURE)
ENDOLOOP SUT PDS II 0 18 (SUTURE) IMPLANT
GLOVE BIO SURGEON STRL SZ7.5 (GLOVE) IMPLANT
GOWN STRL REUS W/ TWL LRG LVL3 (GOWN DISPOSABLE) ×2 IMPLANT
GOWN STRL REUS W/ TWL XL LVL3 (GOWN DISPOSABLE) ×1 IMPLANT
GOWN STRL REUS W/TWL LRG LVL3 (GOWN DISPOSABLE) ×4
GOWN STRL REUS W/TWL XL LVL3 (GOWN DISPOSABLE) ×2
GRASPER SUT TROCAR 14GX15 (MISCELLANEOUS) ×3 IMPLANT
KIT BASIN OR (CUSTOM PROCEDURE TRAY) ×3 IMPLANT
KIT ROOM TURNOVER OR (KITS) ×3 IMPLANT
NEEDLE INSUFFLATION 14GA 120MM (NEEDLE) ×3 IMPLANT
NS IRRIG 1000ML POUR BTL (IV SOLUTION) ×3 IMPLANT
PAD ARMBOARD 7.5X6 YLW CONV (MISCELLANEOUS) ×6 IMPLANT
SCISSORS LAP 5X35 DISP (ENDOMECHANICALS) IMPLANT
SET IRRIG TUBING LAPAROSCOPIC (IRRIGATION / IRRIGATOR) ×3 IMPLANT
SLEEVE ENDOPATH XCEL 5M (ENDOMECHANICALS) ×6 IMPLANT
SPECIMEN JAR SMALL (MISCELLANEOUS) ×3 IMPLANT
SUT MNCRL AB 3-0 PS2 18 (SUTURE) ×3 IMPLANT
SUT SILK 2 0 SH (SUTURE) IMPLANT
TOWEL OR 17X24 6PK STRL BLUE (TOWEL DISPOSABLE) IMPLANT
TOWEL OR 17X26 10 PK STRL BLUE (TOWEL DISPOSABLE) ×3 IMPLANT
TRAY FOLEY CATH 16FR SILVER (SET/KITS/TRAYS/PACK) IMPLANT
TRAY LAPAROSCOPIC MC (CUSTOM PROCEDURE TRAY) ×3 IMPLANT
TROCAR BLADELESS 12MM (ENDOMECHANICALS) ×3 IMPLANT
TROCAR XCEL NON-BLD 11X100MML (ENDOMECHANICALS) ×3 IMPLANT
TROCAR XCEL NON-BLD 5MMX100MML (ENDOMECHANICALS) ×3 IMPLANT
TUBING INSUFFLATION (TUBING) ×3 IMPLANT

## 2016-07-28 NOTE — Op Note (Signed)
Operative Report  Imagene Richesldean Cermak 56 y.o. female  [id number]  [id number]  07/28/2016  Surgeon: Berna Buehelsea A Lorrie Strauch   Assistant: none  Procedure performed: Laparoscopic Appendectomy  Preop diagnosis: Acute appendicitis  Post-op diagnosis/intraop findings: Acute appendicitis  Specimens: appendix  EBL: minimal  Complications: none  Description of procedure: After obtaining informed consent the patient was brought to the operating room. Antibiotics and subcutaneous heparin were administered. SCD's were applied. General endotracheal anesthesia was initiated and a formal time-out was performed. The abdomen was prepped and draped in the usual sterile fashion and the abdomen was entered using an infraumbilical Veress needle and insufflated to 15 mmHg. A 5 mm trocar and camera were then introduced, the abdomen was inspected and there is no evidence of injury from our entry. A suprapubic 5 mm trocar and a left lower quadrant 12 mm trocar were introduced under direct visualization following infiltration with local. A small amount of omental adhesions to the anterior abdominal wall were lysed with the harmonic. The patient was then placed in Trendelenburg and rotated to the left and the small bowel was reflected cephalad. The appendix was visualized: it was edematous and firm; no free fluid or purulence was present.  A window was created at the base of the appendix and a blue load linear cutting stapler was used to transect the appendix from the cecum. The harmonic was then used to transect the appendiceal mesentery. Hemostasis was ensured. The appendix was placed in an Endo Catch bag and removed through our 12 mm trocar. The 12mm trocar site in the left lower quadrant was closed with a 0 vicryl in the fascia under direct visualization using a PMI device. The abdomen was desufflated and all trocars removed. The skin incisions were closed with running subcuticular monocryl and Dermabond. The patient was  awakened, extubated and transported to the recovery room in stable condition.   All counts were correct at the completion of the case.

## 2016-07-28 NOTE — ED Notes (Signed)
Report called to OR Nurse.  Patient transported to OR 36.  [facility] there to receive patient

## 2016-07-28 NOTE — ED Triage Notes (Signed)
Pt has been being treated for UTI-- on macrobid at present--- was sent here from urgent care to recheck abd pain-- had a pelvic exam done at urgent care, has also been running a temp. No uti sx at present

## 2016-07-28 NOTE — Anesthesia Procedure Notes (Signed)
Procedure Name: Intubation Date/Time: 07/28/2016 9:02 PM Performed by: Tyreka Henneke S Pre-anesthesia Checklist: Patient identified, Emergency Drugs available, Suction available, Patient being monitored and Timeout performed Patient Re-evaluated:Patient Re-evaluated prior to inductionOxygen Delivery Method: Circle system utilized Preoxygenation: Pre-oxygenation with 100% oxygen Intubation Type: IV induction, Rapid sequence and Cricoid Pressure applied Ventilation: Mask ventilation without difficulty Laryngoscope Size: Mac and 3 Tube type: Oral Tube size: 7.5 mm Number of attempts: 1 Airway Equipment and Method: Stylet Placement Confirmation: ETT inserted through vocal cords under direct vision,  positive ETCO2 and breath sounds checked- equal and bilateral Secured at: 22 cm Tube secured with: Tape Dental Injury: Teeth and Oropharynx as per pre-operative assessment

## 2016-07-28 NOTE — Transfer of Care (Signed)
Immediate Anesthesia Transfer of Care Note  Patient: Theresa RichesAldean Bradford  Procedure(s) Performed: Procedure(s): APPENDECTOMY LAPAROSCOPIC (N/A)  Patient Location: PACU  Anesthesia Type:General  Level of Consciousness: awake, alert  and oriented  Airway & Oxygen Therapy: Patient Spontanous Breathing and Patient connected to face mask oxygen  Post-op Assessment: Report given to RN and Post -op Vital signs reviewed and stable  Post vital signs: Reviewed and stable  Last Vitals:  Vitals:   07/28/16 1440 07/28/16 1815  BP: 177/94 120/70  Pulse: 86 84  Resp: 18   Temp: 37.7 C     Last Pain:  Vitals:   07/28/16 1814  PainSc: 5          Complications: No apparent anesthesia complications

## 2016-07-28 NOTE — ED Provider Notes (Signed)
Emergency Department Provider Note   I have reviewed the triage vital signs and the nursing notes.   HISTORY  Chief Complaint Abdominal Pain and Nausea   HPI Theresa Bradford is a 56 y.o. female with PMH of HTN and frequent UTIs presents to the emergency department for evaluation of abdominal pain, nausea, and dysuria. Patient states that 2 weeks ago she was diagnosed with urinary tract infection and started on Cipro. She continued to have dysuria and mild abdominal discomfort that was nonfocal and so was transitioned to a second antibiotic. She took 7 days of that medication and went back to her physician who started her on Macrobid. She finished the Macrobid but continued to have symptoms. Today she developed more severe lower abdominal pain that was unlike symptoms she is having in the prior 2 weeks. She had some nausea but no vomiting. No diarrhea. No fevers or chills. No back pain. Denies any hematuria. No vaginal discharge. She went to the urgent care where a pelvic exam was performed and she was referred to the emergency department because of her tenderness on abdominal exam.    Past Medical History:  Diagnosis Date  . Frequent sinus infections   . Hypertension     Patient Active Problem List   Diagnosis Date Noted  . Appendicitis 07/28/2016    Past Surgical History:  Procedure Laterality Date  . CESAREAN SECTION    . tummy tuck        Allergies Caffeine; Ibuprofen; Other; and Latex  No family history on file.  Social History Social History  Substance Use Topics  . Smoking status: Never Smoker  . Smokeless tobacco: Never Used  . Alcohol use No    Review of Systems  Constitutional: No fever/chills Eyes: No visual changes. ENT: No sore throat. Cardiovascular: Denies chest pain. Respiratory: Denies shortness of breath. Gastrointestinal: Positive lower abdominal pain. Positive nausea, no vomiting.  No diarrhea.  No constipation. Genitourinary: Negative for  dysuria. Positive urinary frequency. No vaginal discharge or bleeding.  Musculoskeletal: Negative for back pain. Skin: Negative for rash. Neurological: Negative for headaches, focal weakness or numbness.  10-point ROS otherwise negative.  ____________________________________________   PHYSICAL EXAM:  VITAL SIGNS: ED Triage Vitals  Enc Vitals Group     BP 07/28/16 1440 177/94     Pulse Rate 07/28/16 1440 86     Resp 07/28/16 1440 18     Temp 07/28/16 1440 99.8 F (37.7 C)     Temp src --      SpO2 07/28/16 1440 100 %     Weight 07/28/16 1441 180 lb (81.6 kg)     Height 07/28/16 1441 5\' 5"  (1.651 m)     Pain Score 07/28/16 1442 9   Constitutional: Alert and oriented. Well appearing and in no acute distress. Eyes: Conjunctivae are normal.  Head: Atraumatic. Nose: No congestion/rhinnorhea. Mouth/Throat: Mucous membranes are moist.  Oropharynx non-erythematous. Neck: No stridor.  Cardiovascular: Normal rate, regular rhythm. Good peripheral circulation. Grossly normal heart sounds.   Respiratory: Normal respiratory effort.  No retractions. Lungs CTAB. Gastrointestinal: Soft with focal RLQ tenderness to palpation with guarding. No distention.  Musculoskeletal: No lower extremity tenderness nor edema. No gross deformities of extremities. Neurologic:  Normal speech and language. No gross focal neurologic deficits are appreciated.  Skin:  Skin is warm, dry and intact. No rash noted. Psychiatric: Mood and affect are normal. Speech and behavior are normal.  ____________________________________________   LABS (all labs ordered are listed, but only  abnormal results are displayed)  Labs Reviewed  COMPREHENSIVE METABOLIC PANEL - Abnormal; Notable for the following:       Result Value   Glucose, Bld 126 (*)    ALT 13 (*)    All other components within normal limits  URINALYSIS, ROUTINE W REFLEX MICROSCOPIC - Abnormal; Notable for the following:    Color, Urine COLORLESS (*)     Specific Gravity, Urine 1.002 (*)    Hgb urine dipstick SMALL (*)    Bacteria, UA RARE (*)    Squamous Epithelial / LPF 0-5 (*)    All other components within normal limits  LIPASE, BLOOD  CBC  HIV ANTIBODY (ROUTINE TESTING)  BASIC METABOLIC PANEL  CBC  SURGICAL PATHOLOGY   ____________________________________________  RADIOLOGY  Ct Abdomen Pelvis W Contrast  Result Date: 07/28/2016 CLINICAL DATA:  Right lower quadrant abdominal pain. EXAM: CT ABDOMEN AND PELVIS WITH CONTRAST TECHNIQUE: Multidetector CT imaging of the abdomen and pelvis was performed using the standard protocol following bolus administration of intravenous contrast. CONTRAST:  ISOVUE-300 IOPAMIDOL (ISOVUE-300) INJECTION 61% COMPARISON:  None. FINDINGS: Lower chest: Clear lung bases. Normal heart size without pericardial or pleural effusion. Tiny hiatal hernia. Hepatobiliary: Normal liver. Normal gallbladder, without biliary ductal dilatation. Pancreas: Normal, without mass or ductal dilatation. Spleen: Normal in size, without focal abnormality. Adrenals/Urinary Tract: Normal adrenal glands. Normal kidneys, without hydronephrosis. Normal urinary bladder. Stomach/Bowel: Normal remainder of the stomach. Colonic stool burden suggests constipation. Normal terminal ileum on image 49/series 201. The appendix is enlarged and inflamed, including on image 52/series 201 and image 43 coronal. No periappendiceal fluid collection or extraluminal gas. Vascular/Lymphatic: Normal caliber of the aorta and branch vessels. Retroaortic left renal vein. No abdominopelvic adenopathy. Reproductive: Normal uterus and adnexa. Other: Trace cul-de-sac fluid. Musculoskeletal: No acute osseous abnormality. IMPRESSION: 1. Non complicated appendicitis. 2.  Possible constipation. 3. Trace cul-de-sac fluid could be secondary to appendicitis or physiologic. These results were called by telephone at the time of interpretation on 07/28/2016 at 7:39 pm to Dr. Alona Bene , who verbally acknowledged these results. Electronically Signed   By: Jeronimo Greaves M.D.   On: 07/28/2016 19:41    ____________________________________________   PROCEDURES  Procedure(s) performed:   Procedures  None ____________________________________________   INITIAL IMPRESSION / ASSESSMENT AND PLAN / ED COURSE  Pertinent labs & imaging results that were available during my care of the patient were reviewed by me and considered in my medical decision making (see chart for details).  Patient resents to the emergency department for evaluation of worsening lower abdominal pain with nausea. She has been treated with multiple anabiotic's for presumed urinary tract infection. On exam in the emergency department she has focal right lower quadrant tenderness with guarding. No vaginal bleeding or discharge by report. Labs are largely unremarkable. Plan for CT scan of the abdomen and pelvis to evaluate for right lower quadrant pain etiology.   Patient found to have acute appendicitis. Discussed the case with Radiology and then General Surgery. They plan to admit. Started Zosyn. Patient made NPO on initial evaluation.   Discussed patient's case with General Surgery, Dr. Fredricka Bonine. Patient and family (if present) updated with plan. Care transferred to surgery service.  I reviewed all nursing notes, vitals, pertinent old records, EKGs, labs, imaging (as available).  ____________________________________________  FINAL CLINICAL IMPRESSION(S) / ED DIAGNOSES  Final diagnoses:  Acute appendicitis, unspecified acute appendicitis type     MEDICATIONS GIVEN DURING THIS VISIT:  Medications  enoxaparin (LOVENOX) injection  40 mg ( Subcutaneous Automatically Held 08/06/16 0800)  0.9 %  sodium chloride infusion (not administered)  cefTRIAXone (ROCEPHIN) 2 g in dextrose 5 % 50 mL IVPB ( Intravenous Automatically Held 08/05/16 2015)    And  metroNIDAZOLE (FLAGYL) IVPB 500 mg ( Intravenous  Automatically Held 08/05/16 2015)  acetaminophen (TYLENOL) tablet 650 mg ( Oral MAR Hold 07/28/16 2055)    Or  acetaminophen (TYLENOL) suppository 650 mg ( Rectal MAR Hold 07/28/16 2055)  ibuprofen (ADVIL,MOTRIN) tablet 600 mg ( Oral MAR Hold 07/28/16 2055)  oxyCODONE (Oxy IR/ROXICODONE) immediate release tablet 5-10 mg ( Oral MAR Hold 07/28/16 2055)  HYDROmorphone (DILAUDID) injection 0.5 mg ( Intravenous MAR Hold 07/28/16 2055)  diphenhydrAMINE (BENADRYL) 12.5 MG/5ML elixir 12.5 mg ( Oral MAR Hold 07/28/16 2055)    Or  diphenhydrAMINE (BENADRYL) injection 12.5 mg ( Intravenous MAR Hold 07/28/16 2055)  docusate sodium (COLACE) capsule 100 mg ( Oral Automatically Held 08/05/16 2200)  senna (SENOKOT) tablet 8.6 mg ( Oral Automatically Held 08/05/16 2200)  magnesium hydroxide (MILK OF MAGNESIA) suspension 30 mL ( Oral MAR Hold 07/28/16 2056)  bisacodyl (DULCOLAX) suppository 10 mg ( Rectal MAR Hold 07/28/16 2056)  ondansetron (ZOFRAN-ODT) disintegrating tablet 4 mg ( Oral MAR Hold 07/28/16 2056)    Or  ondansetron (ZOFRAN) injection 4 mg ( Intravenous MAR Hold 07/28/16 2056)  hydrALAZINE (APRESOLINE) injection 10 mg ( Intravenous MAR Hold 07/28/16 2056)  oxyCODONE (Oxy IR/ROXICODONE) immediate release tablet 5 mg (not administered)    Or  oxyCODONE (ROXICODONE) 5 MG/5ML solution 5 mg (not administered)  HYDROmorphone (DILAUDID) injection 0.25-0.5 mg (0.5 mg Intravenous Given 07/28/16 2234)  ondansetron (ZOFRAN) 4 MG/2ML injection (not administered)  sodium chloride 0.9 % bolus 1,000 mL (0 mLs Intravenous Stopped 07/28/16 2026)  iopamidol (ISOVUE-300) 61 % injection (100 mLs  Contrast Given 07/28/16 1910)  piperacillin-tazobactam (ZOSYN) IVPB 3.375 g (3.375 g Intravenous New Bag/Given 07/28/16 2026)  ondansetron (ZOFRAN) injection 4 mg (4 mg Intravenous Given 07/28/16 2226)     NEW OUTPATIENT MEDICATIONS STARTED DURING THIS VISIT:  None   Note:  This document was prepared using Dragon voice recognition software and may  include unintentional dictation errors.  Alona Bene, MD Emergency Medicine   Maia Plan, MD 07/28/16 [phone]

## 2016-07-28 NOTE — ED Notes (Signed)
Patient transported to CT 

## 2016-07-28 NOTE — Anesthesia Preprocedure Evaluation (Signed)
Anesthesia Evaluation  Patient identified by MRN, date of birth, ID band Patient awake    Reviewed: Allergy & Precautions, H&P , NPO status , Patient's Chart, lab work & pertinent test results  Airway Mallampati: II   Neck ROM: full    Dental   Pulmonary neg pulmonary ROS,    breath sounds clear to auscultation       Cardiovascular hypertension,  Rhythm:regular Rate:Normal     Neuro/Psych    GI/Hepatic   Endo/Other    Renal/GU      Musculoskeletal   Abdominal   Peds  Hematology   Anesthesia Other Findings   Reproductive/Obstetrics                             Anesthesia Physical Anesthesia Plan  ASA: II and emergent  Anesthesia Plan: General   Post-op Pain Management:    Induction: Intravenous, Rapid sequence and Cricoid pressure planned  Airway Management Planned: Oral ETT  Additional Equipment:   Intra-op Plan:   Post-operative Plan: Extubation in OR  Informed Consent: I have reviewed the patients History and Physical, chart, labs and discussed the procedure including the risks, benefits and alternatives for the proposed anesthesia with the patient or authorized representative who has indicated his/her understanding and acceptance.     Plan Discussed with: CRNA, Anesthesiologist and Surgeon  Anesthesia Plan Comments:         Anesthesia Quick Evaluation

## 2016-07-28 NOTE — H&P (Signed)
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 The pain began as vague and diffuse abdominal pain yesterday evening, and today focalized to the right lower quadrant. No fever. Reports nausea but no emesis, and reports increased frequency of loose stool. Of note, she has a history of frequent UTI's and was having her typical UTI pain/increased frequency starting about 2 weeks ago; she has been on a couple courses of abx and was still having that, but the last 24h is completely different.  Prior abdominal surgery includes c section and tummy tuck.   Allergies  Allergen Reactions  . Other Shortness Of Breath    Beta blockers cause respiratory issues.   . Latex Itching and Rash    Past Medical History:  Diagnosis Date  . Frequent sinus infections   . Hypertension     Past Surgical History:  Procedure Laterality Date  . CESAREAN SECTION    . tummy tuck      No family history on file.  Social History   Social History  . Marital status: Single    Spouse name: N/A  . Number of children: N/A  . Years of education: N/A   Social History Main Topics  . Smoking status: Never Smoker  . Smokeless tobacco: Never Used  . Alcohol use No  . Drug use: No  . Sexual activity: Not Asked   Other Topics Concern  . None   Social History Narrative  . None    No current facility-administered medications on file prior to encounter.    Current Outpatient Prescriptions on File Prior to Encounter  Medication Sig Dispense Refill  . clonazePAM (KLONOPIN) 0.5 MG tablet Take 0.25-0.5 mg by mouth 3 (three) times daily as needed for anxiety.   1  . diltiazem (DILACOR XR) 120 MG 24 hr capsule Take 120 mg by mouth daily.    . fluconazole (DIFLUCAN) 150 MG tablet Take 1 tablet (150 mg total) by mouth daily. May repeat in 4 days if needed 2 tablet 0  . fluticasone (FLONASE) 50 MCG/ACT nasal spray Place 2 sprays into both nostrils  daily. (Patient taking differently: Place 2 sprays into both nostrils daily as needed for allergies. ) 16 g 0  . ibuprofen (ADVIL,MOTRIN) 200 MG tablet Take 200 mg by mouth every 6 (six) hours as needed for headache or mild pain.    Marland Kitchen omeprazole (PRILOSEC) 20 MG capsule Take 1 capsule (20 mg total) by mouth daily. 14 capsule 0    Review of Systems: a complete, 10pt review of systems was completed with pertinent positives and negatives as documented in the HPI.   Physical Exam: Vitals:   07/28/16 1440  BP: 177/94  Pulse: 86  Resp: 18  Temp: 99.8 F (37.7 C)   Gen: A&Ox3, no distress  Head: normocephalic, atraumatic, EOMI, anicteric.  Neck: supple without mass or thyromegaly Chest: unlabored respirations symmetrical air entry  Cardiovascular: RRR with palpable distal pulses Abdomen: soft, minimally distended, TTP in RLQ. Positive Rovsing's sign.  Extremities: warm, without edema, no deformities  Neuro: grossly intact, normal gait Psych: appropriate mood and affect, appropriate insight Skin: no lesions or rashes on limited skin exam   CBC Latest Ref Rng & Units 07/28/2016 01/19/2016 11/20/2015  WBC 4.0 - 10.5 K/uL 10.1 4.2 7.0  Hemoglobin 12.0 - 15.0 g/dL 78.2 95.6 11.7(L)  Hematocrit 36.0 - 46.0 % 40.1 38.5 36.3  Platelets 150 - 400  K/uL 294 264 283    CMP Latest Ref Rng & Units 07/28/2016 01/19/2016 11/20/2015  Glucose 65 - 99 mg/dL 161(W126(H) 960(A118(H) 540(J108(H)  BUN 6 - 20 mg/dL 6 11 8   Creatinine 0.44 - 1.00 mg/dL 8.110.77 9.140.87 7.820.72  Sodium 135 - 145 mmol/L 138 139 140  Potassium 3.5 - 5.1 mmol/L 3.8 3.7 3.8  Chloride 101 - 111 mmol/L 105 102 105  CO2 22 - 32 mmol/L 27 30 30   Calcium 8.9 - 10.3 mg/dL 9.5 9.6 9.4  Total Protein 6.5 - 8.1 g/dL 7.6 7.5 -  Total Bilirubin 0.3 - 1.2 mg/dL 0.5 0.5 -  Alkaline Phos 38 - 126 U/L 45 53 -  AST 15 - 41 U/L 18 35 -  ALT 14 - 54 U/L 13(L) 31 -    No results found for: INR, PROTIME  Imaging: EXAM: CT ABDOMEN AND PELVIS WITH  CONTRAST  TECHNIQUE: Multidetector CT imaging of the abdomen and pelvis was performed using the standard protocol following bolus administration of intravenous contrast.  CONTRAST:  100mL ISOVUE-300 IOPAMIDOL (ISOVUE-300) INJECTION 61%  COMPARISON:  None.  FINDINGS: Lower chest: Clear lung bases. Normal heart size without pericardial or pleural effusion. Tiny hiatal hernia.  Hepatobiliary: Normal liver. Normal gallbladder, without biliary ductal dilatation.  Pancreas: Normal, without mass or ductal dilatation.  Spleen: Normal in size, without focal abnormality.  Adrenals/Urinary Tract: Normal adrenal glands. Normal kidneys, without hydronephrosis. Normal urinary bladder.  Stomach/Bowel: Normal remainder of the stomach. Colonic stool burden suggests constipation. Normal terminal ileum on image 49/series 201. The appendix is enlarged and inflamed, including on image 52/series 201 and image 43 coronal. No periappendiceal fluid collection or extraluminal gas.  Vascular/Lymphatic: Normal caliber of the aorta and branch vessels. Retroaortic left renal vein. No abdominopelvic adenopathy.  Reproductive: Normal uterus and adnexa.  Other: Trace cul-de-sac fluid.  Musculoskeletal: No acute osseous abnormality.  IMPRESSION: 1. Non complicated appendicitis. 2.  Possible constipation. 3. Trace cul-de-sac fluid could be secondary to appendicitis or physiologic. These results were called by telephone at the time of interpretation on 07/28/2016 at 7:39 pm to Dr. Alona BeneJOSHUA LONG , who verbally acknowledged these results.  A/P: 56yo with acute appendicitis.. Laparoscopic appendectomy this evening. We discussed the plan, the nature of the surgery and risks of bleeding, infection, pain, scarring, injury to intraabdominal structures, leak, abscess. She understands and is agreeable to proceed with surgery.   Phylliss Blakeshelsea Aneta Hendershott, MD [facility], [place] Pager [phone]

## 2016-07-29 ENCOUNTER — Encounter (HOSPITAL_COMMUNITY): Payer: Self-pay

## 2016-07-29 LAB — BASIC METABOLIC PANEL
Anion gap: 5 (ref 5–15)
BUN: 5 mg/dL — AB (ref 6–20)
CALCIUM: 8.3 mg/dL — AB (ref 8.9–10.3)
CO2: 26 mmol/L (ref 22–32)
CREATININE: 0.85 mg/dL (ref 0.44–1.00)
Chloride: 109 mmol/L (ref 101–111)
GFR calc Af Amer: >60 mL/min (ref >60)
GLUCOSE: 160 mg/dL — AB (ref 65–99)
Potassium: 4.2 mmol/L (ref 3.5–5.1)
Sodium: 140 mmol/L (ref 135–145)

## 2016-07-29 LAB — CBC
HEMATOCRIT: 36.2 % (ref 36.0–46.0)
Hemoglobin: 11.6 g/dL — ABNORMAL LOW (ref 12.0–15.0)
MCH: 28.3 pg (ref 26.0–34.0)
MCHC: 32 g/dL (ref 30.0–36.0)
MCV: 88.3 fL (ref 78.0–100.0)
Platelets: 283 10*3/uL (ref 150–400)
RBC: 4.1 MIL/uL (ref 3.87–5.11)
RDW: 13.8 % (ref 11.5–15.5)
WBC: 9.7 10*3/uL (ref 4.0–10.5)

## 2016-07-29 LAB — HIV ANTIBODY (ROUTINE TESTING W REFLEX): HIV SCREEN 4TH GENERATION: NONREACTIVE

## 2016-07-29 MED ORDER — PHENOL 1.4 % MT LIQD
1.0000 | OROMUCOSAL | Status: DC | PRN
  Filled 2016-07-29: qty 177

## 2016-07-29 NOTE — Progress Notes (Signed)
[facility] Progress Note  1 Day Post-Op  Subjective: Abdominal pain is controlled with the pain meds. Pt had nausea overnight but it was relieved with the zofran. Pt has not eaten since surgery. She is complaining of chronic shooting pain behind her eyes and in her frontal sinuses. She was seen by ENT 2 months ago for this and is scheduled for a test this Friday. She states she has not had a CT scan of her head to evaluate her sinuses for 2 years. I instructed pt to f/u with her PCP and the ENT specialist regarding her chronic sinus issues. I told her to keep her appointment for Friday with the ENT.  Objective: Vital signs in last 24 hours: Temp:  [97.3 F (36.3 C)-100 F (37.8 C)] 98.2 F (36.8 C) (03/05 0338) Pulse Rate:  [70-107] 70 (03/05 0338) Resp:  [15-24] 18 (03/05 0338) BP: (120-177)/(70-97) 143/77 (03/05 0338) SpO2:  [95 %-100 %] 98 % (03/05 0338) Weight:  [179 lb 14.3 oz (81.6 kg)-180 lb (81.6 kg)] 179 lb 14.3 oz (81.6 kg) (03/04 2322)    Intake/Output from previous day: 03/04 0701 - 03/05 0700 In: 1978.8 [I.V.:1978.8] Out: 475 [Urine:450; Blood:25] Intake/Output this shift: No intake/output data recorded.  PE: Gen:  Alert, NAD, pleasant, cooperative, well appearing Card:  RRR, no M/G/R heard Pulm:  CTA, no W/R/R, effort normal Abd: Soft, nondistended, hypoactive BS, incisions C/D/I, TTP of the lower abdomen with mild guarding Skin: no rashes noted, warm and dry  Lab Results:   Recent Labs  07/28/16 1443 07/29/16 0510  WBC 10.1 9.7  HGB 12.9 11.6*  HCT 40.1 36.2  PLT 294 283   BMET  Recent Labs  07/28/16 1443 07/29/16 0510  NA 138 140  K 3.8 4.2  CL 105 109  CO2 27 26  GLUCOSE 126* 160*  BUN 6 5*  CREATININE 0.77 0.85  CALCIUM 9.5 8.3*   PT/INR No results for input(s): LABPROT, INR in the last 72 hours. CMP     Component Value Date/Time   NA 140 07/29/2016 0510   K 4.2 07/29/2016 0510   CL 109 07/29/2016 0510   CO2 26  07/29/2016 0510   GLUCOSE 160 (H) 07/29/2016 0510   BUN 5 (L) 07/29/2016 0510   CREATININE 0.85 07/29/2016 0510   CALCIUM 8.3 (L) 07/29/2016 0510   PROT 7.6 07/28/2016 1443   ALBUMIN 4.0 07/28/2016 1443   AST 18 07/28/2016 1443   ALT 13 (L) 07/28/2016 1443   ALKPHOS 45 07/28/2016 1443   BILITOT 0.5 07/28/2016 1443   GFRNONAA >60 07/29/2016 0510   GFRAA >60 07/29/2016 0510   Lipase     Component Value Date/Time   LIPASE 15 07/28/2016 1443       Studies/Results: Ct Abdomen Pelvis W Contrast  Result Date: 07/28/2016 CLINICAL DATA:  Right lower quadrant abdominal pain. EXAM: CT ABDOMEN AND PELVIS WITH CONTRAST TECHNIQUE: Multidetector CT imaging of the abdomen and pelvis was performed using the standard protocol following bolus administration of intravenous contrast. CONTRAST:  100mL ISOVUE-300 IOPAMIDOL (ISOVUE-300) INJECTION 61% COMPARISON:  None. FINDINGS: Lower chest: Clear lung bases. Normal heart size without pericardial or pleural effusion. Tiny hiatal hernia. Hepatobiliary: Normal liver. Normal gallbladder, without biliary ductal dilatation. Pancreas: Normal, without mass or ductal dilatation. Spleen: Normal in size, without focal abnormality. Adrenals/Urinary Tract: Normal adrenal glands. Normal kidneys, without hydronephrosis. Normal urinary bladder. Stomach/Bowel: Normal remainder of the stomach. Colonic stool burden suggests constipation. Normal terminal ileum on image 49/series 201.  The appendix is enlarged and inflamed, including on image 52/series 201 and image 43 coronal. No periappendiceal fluid collection or extraluminal gas. Vascular/Lymphatic: Normal caliber of the aorta and branch vessels. Retroaortic left renal vein. No abdominopelvic adenopathy. Reproductive: Normal uterus and adnexa. Other: Trace cul-de-sac fluid. Musculoskeletal: No acute osseous abnormality. IMPRESSION: 1. Non complicated appendicitis. 2.  Possible constipation. 3. Trace cul-de-sac fluid could be  secondary to appendicitis or physiologic. These results were called by telephone at the time of interpretation on 07/28/2016 at 7:39 pm to Dr. Alona Bene , who verbally acknowledged these results. Electronically Signed   By: Jeronimo Greaves M.D.   On: 07/28/2016 19:41    Anti-infectives: Anti-infectives    Start     Dose/Rate Route Frequency Ordered Stop   07/28/16 2007  cefTRIAXone (ROCEPHIN) 2 g in dextrose 5 % 50 mL IVPB  Status:  Discontinued     2 g 100 mL/hr over 30 Minutes Intravenous Every 24 hours 07/28/16 2008 07/28/16 2324   07/28/16 2007  metroNIDAZOLE (FLAGYL) IVPB 500 mg  Status:  Discontinued     500 mg 100 mL/hr over 60 Minutes Intravenous Every 8 hours 07/28/16 2008 07/28/16 2324   07/28/16 1945  piperacillin-tazobactam (ZOSYN) IVPB 3.375 g     3.375 g 100 mL/hr over 30 Minutes Intravenous  Once 07/28/16 1941 07/28/16 2056       Assessment/Plan  HTN - home cardizem Anxiety - Klonopin Allergies - singular and flonase  Appendicitis  - S/P Laparoscopic Appendectomy, Dr. Fredricka Bonine, 07/28/16 - pt has been up to urinate, pain is controlled with PO pain meds, and she is having flatus - Pt has not eaten since surgery - Will ensure pt can tolerate PO before discharge.   FEN: reg diet VTE: lovenox ID: Zosyn once 3/4  Plan: pt states she would like to stay one more night as she lives alone. Will check on pt this afternoon to see if she tolerates her diet and possible discharge home.   LOS: 0 days    Jerre Simon , [facility] 07/29/2016, 9:04 AM Pager: [phone] Consults: [phone] Mon-Fri 7:00 am-4:30 pm Sat-Sun 7:00 am-11:30 am ;fgsp

## 2016-07-29 NOTE — Anesthesia Postprocedure Evaluation (Addendum)
Anesthesia Post Note  Patient: Theresa RichesAldean Bradford  Procedure(s) Performed: Procedure(s) (LRB): APPENDECTOMY LAPAROSCOPIC (N/A)  Patient location during evaluation: PACU Anesthesia Type: General Level of consciousness: awake and alert and patient cooperative Pain management: pain level controlled Vital Signs Assessment: post-procedure vital signs reviewed and stable Respiratory status: spontaneous breathing and respiratory function stable Cardiovascular status: stable Anesthetic complications: no       Last Vitals:  Vitals:   07/28/16 2300 07/28/16 2322  BP: (!) 145/89 140/77  Pulse: 86 75  Resp: 18 18  Temp: 37.7 C 37.8 C    Last Pain:  Vitals:   07/28/16 2322  TempSrc: Oral  PainSc: 3                  Rya Rausch S

## 2016-07-30 ENCOUNTER — Encounter (HOSPITAL_COMMUNITY): Payer: Self-pay | Admitting: *Deleted

## 2016-07-30 MED ORDER — OXYCODONE HCL 5 MG PO TABS: 5.0000 mg | ORAL_TABLET | ORAL | 0 refills | Status: DC | PRN

## 2016-07-30 NOTE — Discharge Instructions (Signed)
Please arrive at least 30 min before your appointment to complete your check in paperwork.  If you are unable to arrive 30 min prior to your appointment time we may have to cancel or reschedule you. ° °LAPAROSCOPIC SURGERY: POST OP INSTRUCTIONS  °1. DIET: Follow a light bland diet the first 24 hours after arrival home, such as soup, liquids, crackers, etc. Be sure to include lots of fluids daily. Avoid fast food or heavy meals as your are more likely to get nauseated. Eat a low fat the next few days after surgery.  °2. Take your usually prescribed home medications unless otherwise directed. °3. PAIN CONTROL:  °1. Pain is best controlled by a usual combination of three different methods TOGETHER:  °1. Ice/Heat °2. Over the counter pain medication °3. Prescription pain medication °2. Most patients will experience some swelling and bruising around the incisions. Ice packs or heating pads (30-60 minutes up to 6 times a day) will help. Use ice for the first few days to help decrease swelling and bruising, then switch to heat to help relax tight/sore spots and speed recovery. Some people prefer to use ice alone, heat alone, alternating between ice & heat. Experiment to what works for you. Swelling and bruising can take several weeks to resolve.  °3. It is helpful to take an over-the-counter pain medication regularly for the first few weeks. Choose one of the following that works best for you:  °1. Naproxen (Aleve, etc) Two 220mg tabs twice a day °2. Ibuprofen (Advil, etc) Three 200mg tabs four times a day (every meal & bedtime) °3. Acetaminophen (Tylenol, etc) 500-650mg four times a day (every meal & bedtime) °4. A prescription for pain medication (such as oxycodone, hydrocodone, etc) should be given to you upon discharge. Take your pain medication as prescribed.  °1. If you are having problems/concerns with the prescription medicine (does not control pain, nausea, vomiting, rash, itching, etc), please call us [phone] to see if we need to switch you to a different pain medicine that will work better for you and/or control your side effect better. °2. If you need a refill on your pain medication, please contact your pharmacy. They will contact our office to request authorization. Prescriptions will not be filled after 5 pm or on week-ends. °4. Avoid getting constipated. Between the surgery and the pain medications, it is common to experience some constipation. Increasing fluid intake and taking a fiber supplement (such as Metamucil, Citrucel, FiberCon, MiraLax, etc) 1-2 times a day regularly will usually help prevent this problem from occurring. A mild laxative (prune juice, Milk of Magnesia, MiraLax, etc) should be taken according to package directions if there are no bowel movements after 48 hours.  °5. Watch out for diarrhea. If you have many loose bowel movements, simplify your diet to bland foods & liquids for a few days. Stop any stool softeners and decrease your fiber supplement. Switching to mild anti-diarrheal medications (Kayopectate, Pepto Bismol) can help. If this worsens or does not improve, please call us. °6. Wash / shower every day. You may shower over the dressings as they are waterproof. Continue to shower over incision(s) after the dressing is off. °7. Remove your waterproof bandages 5 days after surgery. You may leave the incision open to air. You may replace a dressing/Band-Aid to cover the incision for comfort if you wish.  °8. ACTIVITIES as tolerated:  °1. You may resume regular (light) daily activities beginning the next day--such as daily self-care, walking, climbing stairs--gradually   increasing activities as tolerated. If you can walk 30 minutes without difficulty, it is safe to try more intense activity such as jogging, treadmill, bicycling, low-impact aerobics, swimming, etc. 2. DO NOT LIFT >15lbs for 4-6 weeks.  3. DO NOT PUSH THROUGH PAIN. Let pain be your guide: If it hurts to do  something, don't do it. Pain is your body warning you to avoid that activity for another week until the pain goes down. 4. You may drive when you are no longer taking prescription pain medication, you can comfortably wear a seatbelt, and you can safely maneuver your car and apply brakes. 5. You may have sexual intercourse when it is comfortable.  9. FOLLOW UP in our office  1. Please call [facility] at [phone] to set up an appointment to see your surgeon in the office for a follow-up appointment approximately 2-3 weeks after your surgery. 2. Make sure that you call for this appointment the day you arrive home to insure a convenient appointment time.      10. IF YOU HAVE DISABILITY OR FAMILY LEAVE FORMS, BRING THEM TO THE               OFFICE FOR PROCESSING.   WHEN TO CALL US [phone]:  1. Poor pain control 2. Reactions / problems with new medications (rash/itching, nausea, etc)  3. Fever over 101.5 F (38.5 C) 4. Inability to urinate 5. Nausea and/or vomiting 6. Worsening swelling or bruising 7. Continued bleeding from incision. 8. Increased pain, redness, or drainage from the incision  The clinic staff is available to answer your questions during regular business hours (8:30am-5pm). Please dont hesitate to call and ask to speak to one of our nurses for clinical concerns.  If you have a medical emergency, go to the nearest emergency room or call 911.  A surgeon from [facility] is always on call at the [facility]   [facility], [place]  [address], [address], [place], [place] [address] ?  MAIN: [phone] ? TOLL FREE: [phone] ?  FAX [phone]  [email or web address]

## 2016-07-30 NOTE — Progress Notes (Signed)
Patient was discharged from unit to home.  Family member arrived from out of town to pick her up.  She was taken to the main floor via w/c and nursing tech.  Patient is alert and oriented x4; pain medicine given for the trip.  Patient very pleasant and ready for home.

## 2016-07-30 NOTE — Discharge Summary (Signed)
[facility] Discharge Summary   Patient ID: [name] MRN: [id number] DOB/AGE: 56/07/1960 56 y.o.  Admit date: 07/28/2016 Discharge date: 07/30/2016  Admitting Diagnosis: Appendicitis HTN Anxiety Seasonal Allergies  Discharge Diagnosis Patient Active Problem List   Diagnosis Date Noted  . Appendicitis 07/28/2016    Consultants None  Imaging: Ct Abdomen Pelvis W Contrast  Result Date: 07/28/2016 CLINICAL DATA:  Right lower quadrant abdominal pain. EXAM: CT ABDOMEN AND PELVIS WITH CONTRAST TECHNIQUE: Multidetector CT imaging of the abdomen and pelvis was performed using the standard protocol following bolus administration of intravenous contrast. CONTRAST:  ISOVUE-300 IOPAMIDOL (ISOVUE-300) INJECTION 61% COMPARISON:  None. FINDINGS: Lower chest: Clear lung bases. Normal heart size without pericardial or pleural effusion. Tiny hiatal hernia. Hepatobiliary: Normal liver. Normal gallbladder, without biliary ductal dilatation. Pancreas: Normal, without mass or ductal dilatation. Spleen: Normal in size, without focal abnormality. Adrenals/Urinary Tract: Normal adrenal glands. Normal kidneys, without hydronephrosis. Normal urinary bladder. Stomach/Bowel: Normal remainder of the stomach. Colonic stool burden suggests constipation. Normal terminal ileum on image 49/series 201. The appendix is enlarged and inflamed, including on image 52/series 201 and image 43 coronal. No periappendiceal fluid collection or extraluminal gas. Vascular/Lymphatic: Normal caliber of the aorta and branch vessels. Retroaortic left renal vein. No abdominopelvic adenopathy. Reproductive: Normal uterus and adnexa. Other: Trace cul-de-sac fluid. Musculoskeletal: No acute osseous abnormality. IMPRESSION: 1. Non complicated appendicitis. 2.  Possible constipation. 3. Trace cul-de-sac fluid could be secondary to appendicitis or physiologic. These results were called by telephone at the time of interpretation on  07/28/2016 at 7:39 pm to Dr. Alona Bene , who verbally acknowledged these results. Electronically Signed   By: Jeronimo Greaves M.D.   On: 07/28/2016 19:41    Procedures Dr. Fredricka Bonine (07/28/16) - Laparoscopic Appendectomy  Hospital Course:  Theresa Bradford is a 56 year old female with a history of HTN, seasonal allergies and anxiety who presented to [facility] with abdominal pain.  Workup showed appendicitis.  Patient was admitted and underwent procedure listed above.  Tolerated procedure well and was transferred to the floor.  Diet was advanced as tolerated.  On POD#2, the patient was voiding well, tolerating diet, ambulating well, pain well controlled, vital signs stable, incisions c/d/i and felt stable for discharge home.  Patient will follow up in our office in 2 weeks and knows to call with questions or concerns.  She will call to confirm appointment date/time.    Patient was discharged in good condition.  The [place] Substance controlled database was reviewed prior to prescribing narcotic pain medication to this patient.  Physical Exam: General:  Alert, NAD, pleasant, cooperative, well appearing Cardio: RRR, S1 & S2 normal, no murmur, rubs, gallops Resp: Effort and rate normal. No increased work of breathing, CTA Abd:  Soft, +BS, nondistended, mild tenderness, incisions without surrounding erythema, edema or drainage   Allergies as of 07/30/2016      Reactions   Caffeine Palpitations   Ibuprofen Palpitations   Other Shortness Of Breath   Beta blockers cause respiratory issues.    Latex Itching, Rash      Medication List    TAKE these medications   clonazePAM 0.5 MG tablet Commonly known as:  KLONOPIN Take 0.25-0.5 mg by mouth 3 (three) times daily as needed for anxiety.   diltiazem 120 MG 24 hr capsule Commonly known as:  DILACOR XR Take 120 mg by mouth daily.   fluconazole 150 MG tablet Commonly known as:  DIFLUCAN Take 1 tablet (150 mg total)  by mouth daily. May repeat in 4  days if needed   fluticasone 50 MCG/ACT nasal spray Commonly known as:  FLONASE Place 2 sprays into both nostrils daily. What changed:  when to take this  reasons to take this   montelukast 10 MG tablet Commonly known as:  SINGULAIR Take 10 mg by mouth at bedtime.   nitrofurantoin (macrocrystal-monohydrate) 100 MG capsule Commonly known as:  MACROBID Take 100 mg by mouth 2 (two) times daily.   oxyCODONE 5 MG immediate release tablet Commonly known as:  Oxy IR/ROXICODONE Take 1 tablet (5 mg total) by mouth every 4 (four) hours as needed for moderate pain.          Signed: Kathyrn DrownJessica L Eshawn Coor [facility] 07/30/2016, 11:50 AM Pager: [phone] Consults: [phone] Mon-Fri 7:00 am-4:30 pm Sat-Sun 7:00 am-11:30 am

## 2016-10-23 ENCOUNTER — Ambulatory Visit: Payer: 59 | Admitting: Physical Therapy

## 2016-10-23 ENCOUNTER — Ambulatory Visit: Payer: 59

## 2016-11-08 ENCOUNTER — Ambulatory Visit: Payer: 59 | Attending: Pediatrics

## 2016-11-08 DIAGNOSIS — R42 Dizziness and giddiness: Secondary | ICD-10-CM | POA: Insufficient documentation

## 2016-11-08 DIAGNOSIS — R2689 Other abnormalities of gait and mobility: Secondary | ICD-10-CM | POA: Diagnosis present

## 2016-11-08 NOTE — Patient Instructions (Signed)
Gaze Stabilization: Tip Card  1.Target must remain in focus, not blurry, and appear stationary while head is in motion. 2.Perform exercises with small head movements (45 to either side of midline). 3.Increase speed of head motion so long as target is in focus. 4.If you wear eyeglasses, be sure you can see target through lens (therapist will give specific instructions for bifocal / progressive lenses). 5.These exercises may provoke dizziness or nausea. Work through these symptoms. If too dizzy, slow head movement slightly. Rest between each exercise. 6.Exercises demand concentration; avoid distractions.  Copyright  VHI. All rights reserved.   Gaze Stabilization: Sitting    Keeping eyes on target on wall 3-5 feet away, tilt head down 15-30 and move head side to side for __20-30__ seconds. Repeat while moving head up and down for _20-30___ seconds. Do __2-3__ sessions per day.  Copyright  VHI. All rights reserved.    

## 2016-11-09 NOTE — Therapy (Signed)
[facility] [address] [place], [place], [address] Phone: [phone]   Fax:  [phone]  Physical Therapy Evaluation  Patient Details  Name: Theresa Bradford MRN: [id number] Date of Birth: [birth year] Referring Provider: Dr. Verdie Drown  Encounter Date: 11/08/2016      PT End of Session - 11/09/16 1909    Visit Number 1   Number of Visits 9   Date for PT Re-Evaluation 12/08/16   Authorization Type Aetna: no auth required or visit limit   PT Start Time 1533   PT Stop Time 1616   PT Time Calculation (min) 43 min   Activity Tolerance Patient tolerated treatment well   Behavior During Therapy [facility] for tasks assessed/performed      Past Medical History:  Diagnosis Date  . Frequent sinus infections   . Hypertension     Past Surgical History:  Procedure Laterality Date  . CESAREAN SECTION    . LAPAROSCOPIC APPENDECTOMY N/A 07/28/2016   Procedure: APPENDECTOMY LAPAROSCOPIC;  Surgeon: Berna Bue, MD;  Location: [facility] OR;  Service: General;  Laterality: N/A;  . tummy tuck      There were no vitals filed for this visit.       Subjective Assessment - 11/08/16 1539    Subjective Pt reported dizziness began approx. 3-4 years ago but intensity increased after moving to [place]. Dizziness is intermittent and became worse last November, pt feels it is associated with allergies. Pt reported once she stopped taking allergy medication and was feeling better but dizziness became worse after ceasing allergy medication. Pt started allergy medication again a few days ago. Pt describes dizziness as wooziness with intermittent unsteadiness. Pt denied spinning sensation, as she reported she's had that type of vertigo before and its not the same. At worst dizziness is 5/10 and at bests: 0/10. Pt reports dizziness lasts <30 seconds. Pt experiences intermittent dizziness with fast head turns. Pt is not sure what makes it better. Pt denied N/T,  blurred vision, or HA with dizziness. Pt had migraines during menstrual cycle as a teenager.    Pertinent History HTN, recent appendectomy on 09/27/16, seasonal allergies   Patient Stated Goals To find the source of the dizziness.   Currently in Pain? No/denies            [facility] PT Assessment - 11/08/16 1549      Assessment   Medical Diagnosis R sided vestibular weakness   Referring Provider Dr. Verdie Drown   Onset Date/Surgical Date 03/27/16   Hand Dominance Right   Prior Therapy none     Precautions   Precautions Fall   Precaution Comments pt reports unsteadiness during standing when dizziness is increased.     Restrictions   Weight Bearing Restrictions No     Balance Screen   Has the patient fallen in the past 6 months No   Has the patient had a decrease in activity level because of a fear of falling?  No   Is the patient reluctant to leave their home because of a fear of falling?  No     Home Nurse, mental health Private residence   Living Arrangements Alone   Available Help at Discharge Family;Friend(s)   Type of Home Apartment   Home Access Stairs to enter   Entrance Stairs-Number of Steps 12   Entrance Stairs-Rails Right   Home Layout One level   Home Equipment None     Prior Function   Level of Independence Independent  Vocation Full time employment   [employer]er[employer]ocation Requirements Works on the computer in seated position   Leisure Reading, walking     Cognition   Overall Cognitive Status Within Functional Limits for tasks assessed     Sensation   Additional Comments Pt denied N/T     Ambulation/Gait   Ambulation/Gait Yes   Ambulation/Gait Assistance 7: Independent   Ambulation Distance (Feet) 75 Feet   Assistive device None   Gait Pattern Within Functional Limits   Ambulation Surface Level;Indoor   Gait velocity 3.3557ft/sec. no AD            Vestibular Assessment - 11/08/16 1555      Symptom Behavior   Type of Dizziness "Funny feeling in  head"   Frequency of Dizziness Daily but pt reports dizziness had ceased prior to pt ceasing allergy meds   Duration of Dizziness < 30 seconds   Aggravating Factors Turning head quickly  turning head intermittent dizziness   Relieving Factors Medication  pt believes allergy medication helps     Occulomotor Exam   Occulomotor Alignment Normal   Spontaneous Absent   Gaze-induced Absent   Smooth Pursuits Intact   Saccades Intact   Comment Pt reported 1-2/10 dizziness during L sided smooth pursuits and 1/10 dizziness during R sided saccades. B HIT: negative and no dizziness.     Vestibulo-Occular Reflex   VOR 1 Head Only (x 1 viewing) Intact, but pt reported 3/10 dizziness (spinning).    VOR Cancellation Normal     Visual Acuity   Static 7   Dynamic 2  pt reported 3/10 dizziness.     Positional Testing   Dix-Hallpike Dix-Hallpike Right;Dix-Hallpike Left   Horizontal Canal Testing Horizontal Canal Right;Horizontal Canal Left     Dix-Hallpike Right   Dix-Hallpike Right Duration 2/10 dizziness but no nystagmus, pt reported she still felt dizzy from DVA test.   Dix-Hallpike Right Symptoms No nystagmus     Dix-Hallpike Left   Dix-Hallpike Left Duration none   Dix-Hallpike Left Symptoms No nystagmus     Horizontal Canal Right   Horizontal Canal Right Duration none   Horizontal Canal Right Symptoms Normal     Horizontal Canal Left   Horizontal Canal Left Duration none   Horizontal Canal Left Symptoms Normal     Positional Sensitivities   Sit to Supine No dizziness   Supine to Sitting No dizziness        Objective measurements completed on examination: See above findings.           Vestibular Treatment/Exercise - 11/08/16 1610      Vestibular Treatment/Exercise   Vestibular Treatment Provided Gaze   Gaze Exercises X1 Viewing Horizontal;X1 Viewing Vertical     X1 Viewing Horizontal   Foot Position seated   Time --  20-30 seconds   Reps 2   Comments Pt  reported 4/10 dizziness. Please see pt instructions for HEP details. Cues and demo for technique. Pt required rest  breaks to allow dizziness to subside.      X1 Viewing Vertical   Foot Position seated   Time --  20-30 seconds   Reps 2   Comments Pt reported 3/10 dizziness. Please see pt instructions for HEP details. Cues and demo for technique.               PT Education - 11/09/16 1908    Education provided Yes   Education Details PT discussed exam findings, PT frequency/duration and POC. PT provided pt with  gaze stabilization HEP.   Person(s) Educated Patient   Methods Explanation;Demonstration;Verbal cues;Handout   Comprehension Returned demonstration;Verbalized understanding          PT Short Term Goals - 11/09/16 1915      PT SHORT TERM GOAL #1   Title same as LTGs           PT Long Term Goals - 11/09/16 1915      PT LONG TERM GOAL #1   Title Pt will be IND in HEP to improve balance and dizziness. Target date: 12/06/16.   Status New     PT LONG TERM GOAL #2   Title Perform SOT and FGA and write goals as indicated.    Status New     PT LONG TERM GOAL #3   Title Pt will report </=1/10 during amb. 1000' over even/uneven terrain, while performing head turns, in order to improve safety during functional mobility.    Status New     PT LONG TERM GOAL #4   Title Pt will report </=1/10 while performing head turns while standing, in order to safely perform work duties.   Status New                Plan - 11/09/16 1909    Clinical Impression Statement Pt is a pleasant 55y/o femaled presenting to OPPT neuro with dizziness. Pt's PMH is significant for the following: HTN, recent appendectomy on 09/27/16, seasonal allergies. Positional testing was negative for nystagmus. R Dix-Hallpike produced dizziness but pt reported it was residual dizziness s/p visual acuity testing. Pt experineced concordant dizziness during VOR and dynamic visual acuity (DVA) testing. The  line difference (5) during static visual acuity and DVA testing was significant. 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 The following impairments were noted upon exam: dizziness, guarded movements during gait, and impaired balance reported during bout of dizziness. Pt's gait speed indicates pt can safely amb. in the community. PT will perform formal balance testing next session. Pt would benefit from skilled PT to improve safety during functional mobility.    History and Personal Factors relevant to plan of care: pt lives alone and dizziness occurs at work   Clinical Presentation Stable   Clinical Presentation due to: see above   Clinical Decision Making Moderate   Rehab Potential Good   PT Frequency 2x / week   PT Duration 4 weeks   PT Treatment/Interventions ADLs/Self Care Home Management;Biofeedback;Canalith Repostioning;DME Instruction;Gait training;Stair training;Functional mobility training;Therapeutic activities;Therapeutic exercise;Balance training;Patient/family education;Orthotic Fit/Training;Vestibular;Neuromuscular re-education   PT Next Visit Plan Perform SOT and FGA and write goals prn. Review gaze stab. HEP progress as tolerated.    Consulted and Agree with Plan of Care Patient      Patient will benefit from skilled therapeutic intervention in order to improve the following deficits and impairments:  Abnormal gait, Decreased balance, Dizziness  Visit Diagnosis: Dizziness and giddiness - Plan: PT plan of care cert/re-cert  Other abnormalities of gait and mobility - Plan: PT plan of care cert/re-cert     Problem List Patient Active Problem List   Diagnosis Date Noted  . Appendicitis 07/28/2016    Nickie Deren L 11/09/2016, 7:20 PM  [facility] [address] [place], [place], [address] Phone: [phone]   Fax:  [phone]  Name: Theresa Bradford MRN: [id number] Date of Birth:  1960-09-30  Zerita Boers, PT,DPT 11/09/16 7:21 PM Phone: [phone] Fax: [phone]

## 2016-11-15 ENCOUNTER — Ambulatory Visit: Payer: 59 | Admitting: Rehabilitative and Restorative Service Providers"

## 2016-11-29 ENCOUNTER — Ambulatory Visit: Payer: 59 | Admitting: Physical Therapy

## 2016-12-04 ENCOUNTER — Ambulatory Visit: Payer: 59 | Attending: Pediatrics

## 2016-12-04 DIAGNOSIS — R2689 Other abnormalities of gait and mobility: Secondary | ICD-10-CM

## 2016-12-04 DIAGNOSIS — R42 Dizziness and giddiness: Secondary | ICD-10-CM | POA: Diagnosis not present

## 2016-12-04 NOTE — Therapy (Signed)
[facility] [address] [place], [place], [address] Phone: [phone]   Fax:  [phone]  Physical Therapy Treatment  Patient Details  Name: Theresa Bradford MRN: [id number] Date of Birth: May 19, 1961 Referring Provider: Dr. Verdie Drown  Encounter Date: 12/04/2016      PT End of Session - 12/04/16 1013    Visit Number 2   Number of Visits 9   Date for PT Re-Evaluation 12/08/16   Authorization Type Aetna: no auth required or visit limit   PT Start Time [phone]  pt in bathroom   PT Stop Time 0845   PT Time Calculation (min) 34 min   Equipment Utilized During Treatment --  SOT harness and min guard prn   Activity Tolerance Patient tolerated treatment well   Behavior During Therapy [facility] for tasks assessed/performed      Past Medical History:  Diagnosis Date  . Frequent sinus infections   . Hypertension     Past Surgical History:  Procedure Laterality Date  . CESAREAN SECTION    . LAPAROSCOPIC APPENDECTOMY N/A 07/28/2016   Procedure: APPENDECTOMY LAPAROSCOPIC;  Surgeon: Berna Bue, MD;  Location: [facility] OR;  Service: General;  Laterality: N/A;  . tummy tuck      There were no vitals filed for this visit.      Subjective Assessment - 12/04/16 0808    Subjective Pt reported she went to [place] and wasn't dizzy and didn't need to take allergy medication, but allergies/dizziness began as soon as she got back to [place]. Pt is still trying to find a chair that is at eye level for gaze stabilization.   Pertinent History HTN, recent appendectomy on 09/27/16, seasonal allergies   Patient Stated Goals To find the source of the dizziness.   Currently in Pain? No/denies            [facility] PT Assessment - 12/04/16 0818      Functional Gait  Assessment   Gait assessed  Yes   Gait Level Surface Walks 20 ft in less than 7 sec but greater than 5.5 sec, uses assistive device, slower speed, mild gait deviations, or deviates 6-10 in  outside of the 12 in walkway width.  7.9 sec.   Change in Gait Speed Able to smoothly change walking speed without loss of balance or gait deviation. Deviate no more than 6 in outside of the 12 in walkway width.   Gait with Horizontal Head Turns Performs head turns smoothly with no change in gait. Deviates no more than 6 in outside 12 in walkway width   Gait with Vertical Head Turns Performs head turns with no change in gait. Deviates no more than 6 in outside 12 in walkway width.   Gait and Pivot Turn Pivot turns safely within 3 sec and stops quickly with no loss of balance.   Step Over Obstacle Is able to step over 2 stacked shoe boxes taped together (9 in total height) without changing gait speed. No evidence of imbalance.   Gait with Narrow Base of Support Is able to ambulate for 10 steps heel to toe with no staggering.   Gait with Eyes Closed Walks 20 ft, uses assistive device, slower speed, mild gait deviations, deviates 6-10 in outside 12 in walkway width. Ambulates 20 ft in less than 9 sec but greater than 7 sec.  7.7 sec.   Ambulating Backwards Walks 20 ft, no assistive devices, good speed, no evidence for imbalance, normal gait   Steps  Alternating feet, no rail.   Total Score 28   FGA comment: 28/30: indicates pt is at a low falls risk.       Neuro re-ed: Neuro re-ed: sensory organization test performed with following results: Conditions: 1: WNL 2: WNL 3: 1 trial below normal, 2 WNL  4: WNL 5: 2 trials below normal, 1 WNL 6: WNL Composite score: WNL, 77 Sensory Analysis Som: WNL Vis: WNL Vest: WNL (just at normal) Pref: WNL Strategy analysis: Good use of ankle/hip strategy, except decr. Ankle strategy during conditions which challenge vestibular system.       COG alignment: WNL                      Vestibular Treatment/Exercise - 12/04/16 1012      Vestibular Treatment/Exercise   Vestibular Treatment Provided Gaze   Gaze Exercises X1 Viewing  Horizontal;X1 Viewing Vertical     X1 Viewing Horizontal   Foot Position seated   Time --  20-30sec.    Reps 2   Comments Pt reported 5/10 dizziness, which subsided with rest. Please see pt instructions for HEP details. Reviewed and provided pt with another HEP copy. Cues and demo for technique.     X1 Viewing Vertical   Foot Position seated   Time --  20-30sec.   Reps 2   Comments Pt reported 3/10 dizziness. Please see pt instructions for HEP details. Cues and demo for technique.               PT Education - 12/04/16 1013    Education provided Yes   Education Details PT discussed outcome measure results and provided pt with another HEP copy.   Person(s) Educated Patient   Methods Explanation;Demonstration;Verbal cues;Handout   Comprehension Verbalized understanding;Returned demonstration          PT Short Term Goals - 11/09/16 1915      PT SHORT TERM GOAL #1   Title same as LTGs           PT Long Term Goals - 12/04/16 1016      PT LONG TERM GOAL #1   Title Pt will be IND in HEP to improve balance and dizziness. Target date: 12/06/16.   Status New     PT LONG TERM GOAL #2   Title Perform SOT and FGA and write goals as indicated.    Baseline SOT and FGA WNL   Status Deferred     PT LONG TERM GOAL #3   Title Pt will report </=1/10 during amb. 1000' over even/uneven terrain, while performing head turns, in order to improve safety during functional mobility.    Status New     PT LONG TERM GOAL #4   Title Pt will report </=1/10 while performing head turns while standing, in order to safely perform work duties.   Status New               Plan - 12/04/16 1014    Clinical Impression Statement Pt's composite score WNL for her age group. However, vestibular input was just WNL for age group and pt experienced dizziness during activities which require incr. vestibular input. Pt's FGA score indicates pt is at low risk for falls. PT will focus therapy on  vestibular input and progress HEP in order to improve dizziness during functional mobility. Pt will likely d/c early.    Rehab Potential Good   PT Frequency 2x / week   PT Duration 4 weeks  PT Treatment/Interventions ADLs/Self Care Home Management;Biofeedback;Canalith Repostioning;DME Instruction;Gait training;Stair training;Functional mobility training;Therapeutic activities;Therapeutic exercise;Balance training;Patient/family education;Orthotic Fit/Training;Vestibular;Neuromuscular re-education   PT Next Visit Plan Review gaze stab. HEP progress as tolerated. D/c early prn.   Consulted and Agree with Plan of Care Patient      Patient will benefit from skilled therapeutic intervention in order to improve the following deficits and impairments:  Abnormal gait, Decreased balance, Dizziness  Visit Diagnosis: Dizziness and giddiness  Other abnormalities of gait and mobility     Problem List Patient Active Problem List   Diagnosis Date Noted  . Appendicitis 07/28/2016    Shayan Bramhall L 12/04/2016, 10:16 AM  [facility] [address] [place], [place], [address] Phone: [phone]   Fax:  [phone]  Name: Theresa Bradford MRN: [id number] Date of Birth: 07/12/1960

## 2016-12-04 NOTE — Patient Instructions (Signed)
Gaze Stabilization: Tip Card  1.Target must remain in focus, not blurry, and appear stationary while head is in motion. 2.Perform exercises with small head movements (45 to either side of midline). 3.Increase speed of head motion so long as target is in focus. 4.If you wear eyeglasses, be sure you can see target through lens (therapist will give specific instructions for bifocal / progressive lenses). 5.These exercises may provoke dizziness or nausea. Work through these symptoms. If too dizzy, slow head movement slightly. Rest between each exercise. 6.Exercises demand concentration; avoid distractions.  Copyright  VHI. All rights reserved.   Gaze Stabilization: Sitting    Keeping eyes on target on wall 3-5 feet away, tilt head down 15-30 and move head side to side for __20-30__ seconds. Repeat while moving head up and down for _20-30___ seconds. Do __2-3__ sessions per day.  Copyright  VHI. All rights reserved.    

## 2016-12-06 ENCOUNTER — Ambulatory Visit: Payer: 59

## 2016-12-06 DIAGNOSIS — R42 Dizziness and giddiness: Secondary | ICD-10-CM | POA: Diagnosis not present

## 2016-12-06 DIAGNOSIS — R2689 Other abnormalities of gait and mobility: Secondary | ICD-10-CM

## 2016-12-06 NOTE — Therapy (Signed)
[facility] [address] [place], [place], [address] Phone: [phone]   Fax:  [phone]  Physical Therapy Treatment  Patient Details  Name: Theresa Bradford MRN: [id number] Date of Birth: [birth year] Referring Provider: Dr. Meredith Leeds  Encounter Date: 12/06/2016      PT End of Session - 12/06/16 0839    Visit Number 3   Number of Visits 9   Date for PT Re-Evaluation 12/08/16   Authorization Type Aetna: no auth required or visit limit   PT Start Time 0802   PT Stop Time 0833  d/c   PT Time Calculation (min) 31 min   Equipment Utilized During Treatment --  S prn   Activity Tolerance Patient tolerated treatment well   Behavior During Therapy [facility] for tasks assessed/performed      Past Medical History:  Diagnosis Date  . Frequent sinus infections   . Hypertension     Past Surgical History:  Procedure Laterality Date  . CESAREAN SECTION    . LAPAROSCOPIC APPENDECTOMY N/A 07/28/2016   Procedure: APPENDECTOMY LAPAROSCOPIC;  Surgeon: Clovis Riley, MD;  Location: [place];  Service: General;  Laterality: N/A;  . tummy tuck      There were no vitals filed for this visit.      Subjective Assessment - 12/06/16 0805    Subjective Pt reported she has been taking allergy medications and they have been decreasing dizziness. Pt reports 4/10 at worst and 0/10 at best.    Pertinent History HTN, recent appendectomy on 09/27/16, seasonal allergies   Patient Stated Goals To find the source of the dizziness.   Currently in Pain? No/denies                         [facility] Adult PT Treatment/Exercise - 12/06/16 0841      Ambulation/Gait   Ambulation/Gait Yes   Ambulation/Gait Assistance 7: Independent   Ambulation Distance (Feet) 1000 Feet   Assistive device None   Gait Pattern Within Functional Limits   Ambulation Surface Level;Unlevel;Indoor;Outdoor;Paved;Grass   Gait Comments Neuro re-ed: pt performed head  turns during gait to ensure dizziness remained </=1/10 for safety.         Vestibular Treatment/Exercise - 12/06/16 0834      Vestibular Treatment/Exercise   Vestibular Treatment Provided Gaze   Gaze Exercises X1 Viewing Horizontal;X1 Viewing Vertical     X1 Viewing Horizontal   Foot Position standig with feet together on compliant surface   Time --  20-30 sec.   Reps 2   Comments Pt reported 2/10 dizziness for < 5 seconds after performing gaze stabilization in standing. Please see pt instructions for details. Cues and demo for technique.     X1 Viewing Vertical   Foot Position feet together standing on pillow   Time --  20-30 sec.   Reps 2   Comments Please see above.            Balance Exercises - 12/06/16 0807      Balance Exercises: Standing   Standing Eyes Opened Narrow base of support (BOS);Wide (BOA);Head turns;Foam/compliant surface;Solid surface;2 reps;10 secs;30 secs   Standing Eyes Closed Narrow base of support (BOS);Wide (BOA);Head turns;Foam/compliant surface;Solid surface;2 reps;10 secs;30 secs   Tandem Stance Eyes open;Foam/compliant surface;2 reps;20 secs   SLS Eyes open;Solid surface;2 reps;15 secs           PT Education - 12/06/16 0836    Education provided Yes   Education  Details Pt educated on goal progress and d/c based on reduction in dizziness and progress. Pt agreeable. PT also provided pt with progressed vestibular HEP.    Person(s) Educated Patient   Methods Explanation;Handout;Demonstration;Verbal cues   Comprehension Verbalized understanding;Returned demonstration          PT Short Term Goals - 11/09/16 1915      PT SHORT TERM GOAL #1   Title same as LTGs           PT Long Term Goals - 12/06/16 0840      PT LONG TERM GOAL #1   Title Pt will be IND in HEP to improve balance and dizziness. Target date: 12/06/16.   Status Achieved     PT LONG TERM GOAL #2   Title Perform SOT and FGA and write goals as indicated.     Baseline SOT and FGA WNL   Status Deferred     PT LONG TERM GOAL #3   Title Pt will report </=1/10 during amb. 1000' over even/uneven terrain, while performing head turns, in order to improve safety during functional mobility.    Status Achieved     PT LONG TERM GOAL #4   Title Pt will report </=1/10 while performing head turns while standing, in order to safely perform work duties.   Status Achieved               Plan - 12/06/16 0840    Clinical Impression Statement Pt met all LTGs and reports dizziness has improved. Please see d/c summary for details.    Rehab Potential Good   PT Frequency 2x / week   PT Duration 4 weeks   PT Treatment/Interventions ADLs/Self Care Home Management;Biofeedback;Canalith Repostioning;DME Instruction;Gait training;Stair training;Functional mobility training;Therapeutic activities;Therapeutic exercise;Balance training;Patient/family education;Orthotic Fit/Training;Vestibular;Neuromuscular re-education   Consulted and Agree with Plan of Care Patient      Patient will benefit from skilled therapeutic intervention in order to improve the following deficits and impairments:  Abnormal gait, Decreased balance, Dizziness  Visit Diagnosis: Dizziness and giddiness  Other abnormalities of gait and mobility     Problem List Patient Active Problem List   Diagnosis Date Noted  . Appendicitis 07/28/2016    Miller,Jennifer L 12/06/2016, 8:42 AM  [facility] [address] [place], [place], [address] Phone: [phone]   Fax:  [phone]  Name: Theresa Bradford MRN: [id number] Date of Birth: 1961/05/18  PHYSICAL THERAPY DISCHARGE SUMMARY  Visits from Start of Care: 3  Current functional level related to goals / functional outcomes:     PT Long Term Goals - 12/06/16 0840      PT LONG TERM GOAL #1   Title Pt will be IND in HEP to improve balance and dizziness. Target date: 12/06/16.    Status Achieved     PT LONG TERM GOAL #2   Title Perform SOT and FGA and write goals as indicated.    Baseline SOT and FGA WNL   Status Deferred     PT LONG TERM GOAL #3   Title Pt will report </=1/10 during amb. 1000' over even/uneven terrain, while performing head turns, in order to improve safety during functional mobility.    Status Achieved     PT LONG TERM GOAL #4   Title Pt will report </=1/10 while performing head turns while standing, in order to safely perform work duties.   Status Achieved        Remaining deficits: Intermittent dizziness, but less severe.  Education / Equipment: HEP  Plan: Patient agrees to discharge.  Patient goals were met. Patient is being discharged due to meeting the stated rehab goals.  ?????        Geoffry Paradise, PT,DPT 12/06/16 8:43 AM Phone: [phone] Fax: [phone]

## 2016-12-06 NOTE — Patient Instructions (Addendum)
Perform in a corner with chair in front of you for safety OR at kitchen sink with chair behind you:  Feet Together (Compliant Surface) Head Motion - Eyes Closed    Stand on compliant surface: __pillows/cushion______ with feet together. Close eyes and move head slowly, up and down 10 times and side to side 10 times. Repeat __3__ times per session. Do __1__ sessions per day.  Copyright  VHI. All rights reserved.   Gaze Stabilization: Standing Feet Together (Compliant Surface)    Feet together on pillow, keeping eyes on target on wall __3-5__ feet away, tilt head down 15-30 and move head side to side for __20-30__ seconds. Repeat while moving head up and down for __20-30__ seconds. Do __2-3__ sessions per day.  Copyright  VHI. All rights reserved.

## 2016-12-10 ENCOUNTER — Encounter: Payer: 59 | Admitting: Physical Therapy

## 2016-12-20 ENCOUNTER — Encounter: Payer: Self-pay | Admitting: Registered"

## 2016-12-20 ENCOUNTER — Encounter: Payer: 59 | Admitting: Rehabilitative and Restorative Service Providers"

## 2016-12-20 ENCOUNTER — Encounter: Payer: 59 | Attending: Internal Medicine | Admitting: Registered"

## 2016-12-20 DIAGNOSIS — R7303 Prediabetes: Secondary | ICD-10-CM | POA: Insufficient documentation

## 2016-12-20 DIAGNOSIS — Z713 Dietary counseling and surveillance: Secondary | ICD-10-CM | POA: Insufficient documentation

## 2016-12-20 NOTE — Patient Instructions (Addendum)
Consider having Fairlife milk instead of Premier protein. When having fruit for snack be sure to include a protein such as nuts. For low blood sugar try 4 oz of applejuice and wait 15 min, if still feeling shakey can drink another 4 oz of juice. Then follow up with some protein to level out your blood sugar  To help with bloating can try troubleshooting with the high-FODMAP food list  Ideas for better sleep:  Get a bedtime routine to help wind down  Avoid caffeine later in the day including coffee, soda, black tea, green tea, chocolate   Consider Melatonin Extended Released supplement  Magnesium supplement in the evening may help- Calm anti-stress drink, take minimal amount to begin, can also help with constipation  Avoid watching TV or looking at the computer 1 hr before going to sleep  Optimal room temperature for sleep 60-67 degrees fahrenheit  Avoid light in bedroom while sleeping, room darkening curtains may help  More information can be found at https://sleepfoundation.org/bedroom/see.php

## 2016-12-20 NOTE — Progress Notes (Signed)
Medical Nutrition Therapy:  Appt start time: 0805 end time:  0910.  Assessment:  Primary concerns today: Pt states her weight and A1c are going up. Per referral labs, A1c was 5.9 in 2017. Pt states in Jan she weighed 185 lb, now 175 lb. Pt reports she felt better when she was at 150 lbs.  Pt states she has low energy and doesn't like that she gets out of breath when when climbing stairs, which she didn't feel when she weighed 150 lbs.   Pt reports sometimes she feels low blood sugar symptoms (shakey) 2 hours after eating and will treat with apple juice or candy.   Pt states she wants help with strategies to have healthier habits when traveling. Pt states traveling is usually on holidays or long weekends. Pt also states she has a hard time getting back into her healthy routines after returning home from traveling. This will be the focus for a future visit.  Pt states some foods make her extremely bloated, such as broccoli and cabbage.   Pt states she would like regular RD visits to get her on track and pt verbalizes understanding that my focus would not be weight loss, rather other measures of increased health.  Work: 45-50 hours/wk Sleep: 3-4 hours last night, Awake at 2 am . Avg 6.5 hrs. Stress: somewhat increased due to preparing to move into a new home  Preferred Learning Style:   No preference indicated   Learning Readiness:   Change in progress  MEDICATIONS: reviewed. Was taking 5,000 iu of vit D but stopped, will start taking 2,000   DIETARY INTAKE:  24-hr recall:  B ( AM): collard greens, chicken, sweet potato (pancakes make her feel shakes)  Snk ( AM): premier protein shake and Ritz crackers, fruit L ( PM): veggie burger 1/2 bun, salad Snk (3 PM): fruit OR nuts OR fruit and protein shake if really hungry D ( PM):  Same as breakfast Snk ( PM): none OR protein shake OR banana OR mixed veggies Beverages: water, 8 oz apple juice for low blood sugar  Usual physical  activity: walks, bike (tries 5x week) got out of routine last week  Estimated energy needs: 1800 calories 200 g carbohydrates 135 g protein 50 g fat  Progress Towards Goal(s):  In progress.   Nutritional Diagnosis:  NI-5.8.4 Inconsistent carbohydrate intake As related to excessive carbohydrates to treat low blood sugar and only fruit for snacks.  As evidenced by dietary recall and elevated A1c.    Intervention:  Nutrition education for managing blood glucose with diet and lifestyle changes. Described the role of different macronutrients on glucose.  Explained how carbohydrates affect blood glucose. Stated what foods contain the most carbohydrates. Discussed role of exercise and sleep on blood sugar and potentially weight. Discussed foods which can cause excessive bloating (high FODMAP).  Teaching Method Utilized:  Visual Auditory  Handouts given during visit include:  MyPlate   High FODMAPs food list  Barriers to learning/adherence to lifestyle change: none  Demonstrated degree of understanding via:  Teach Back   Monitoring/Evaluation:  Dietary intake, exercise, bloating symptoms, and body weight in 3 week(s).

## 2017-01-15 NOTE — Addendum Note (Signed)
Addendum  created 01/15/17 1205 by Constantine Ruddick, MD   Sign clinical note    

## 2017-01-23 ENCOUNTER — Encounter: Payer: 59 | Attending: Internal Medicine | Admitting: Registered"

## 2017-01-23 ENCOUNTER — Encounter: Payer: Self-pay | Admitting: Registered"

## 2017-01-23 DIAGNOSIS — Z713 Dietary counseling and surveillance: Secondary | ICD-10-CM

## 2017-01-23 DIAGNOSIS — R7303 Prediabetes: Secondary | ICD-10-CM | POA: Diagnosis not present

## 2017-01-23 NOTE — Progress Notes (Signed)
Medical Nutrition Therapy:  Appt start time: 10:00 end time:  10:35  Assessment:  Primary concerns today: Pt states her weight and A1c are going up. Per referral labs, A1c was 5.9 in 2017. Pt states in Jan she weighed 185 lb, now 175 lb. Pt reports she felt better when she was at 150 lbs.   Pt arrives today stating that she wants information on how to prevent hypoglycemia, weight loss ideas, and how to better manage carbohydrates throughout the day. Pt states she has lost 15 lbs since Dec 2017. Pt states she wants to lose weight slowly and steadily, but has recently stalled. Pt states she has been working on increasing daily steps to 10,000 steps/day, 5 days/week. Pt states she travels quite a bite and is unable to continue with usual eating pattern when traveling. Pt states she needs accountability and to be managed.    Pt states she has low energy and doesn't like that she gets out of breath when when climbing stairs, which she didn't feel when she weighed 150 lbs.   Pt reports sometimes she feels low blood sugar symptoms (shakey) 2 hours after eating and will treat with apple juice or candy.   Pt states she wants help with strategies to have healthier habits when traveling. Pt states traveling is usually on holidays or long weekends. Pt also states she has a hard time getting back into her healthy routines after returning home from traveling. This will be the focus for a future visit.  Pt states some foods make her extremely bloated, such as broccoli and cabbage.   Pt states she would like regular RD visits to get her on track and pt verbalizes understanding that my focus would not be weight loss, rather other measures of increased health.  Work: 45-50 hours/wk Sleep: 3-4 hours last night, Awake at 2 am . Avg 6.5 hrs. Stress: somewhat increased due to preparing to move into a new home  Preferred Learning Style:   No preference indicated   Learning Readiness:   Change in  progress  MEDICATIONS: reviewed. Was taking 5,000 iu of vit D but stopped, will start taking 2,000   DIETARY INTAKE:  24-hr recall:  B ( AM): collard greens, chicken, sweet potato (pancakes make her feel shakes)  Snk ( AM): premier protein shake and Ritz crackers, fruit L ( PM): veggie burger 1/2 bun, salad Snk (3 PM): fruit OR nuts OR fruit and protein shake if really hungry D ( PM):  Same as breakfast Snk ( PM): none OR protein shake OR banana OR mixed veggies Beverages: water, 8 oz apple juice for low blood sugar  Usual physical activity: walks, bike (tries 5x week) got out of routine last week  Estimated energy needs: 1800 calories 200 g carbohydrates 135 g protein 50 g fat  Progress Towards Goal(s):  In progress.   Nutritional Diagnosis:  NI-5.8.4 Inconsistent carbohydrate intake As related to excessive carbohydrates to treat low blood sugar and only fruit for snacks.  As evidenced by dietary recall and elevated A1c.    Intervention:  Nutrition education for managing blood glucose with diet and lifestyle changes. RD educated pt on different carbohydrate sources and how to manage them throughout the day.  Goals: - Plan meals and snacks ahead of time to prevent hypoglycemia.  - Pair carbohydrate source with protein source to control blood sugar levels.  - Aim to increase physical activity to at least 30 minutes/day, 5 days/week and increase as tolerated.   Teaching  Method Utilized:  Visual Auditory  Handouts given during visit include:  Planning healthy meals   Barriers to learning/adherence to lifestyle change: none  Demonstrated degree of understanding via:  Teach Back   Monitoring/Evaluation:  Dietary intake, exercise, bloating symptoms, and body weight in 3 week(s).

## 2017-01-23 NOTE — Patient Instructions (Addendum)
-   Plan meals and snacks ahead of time  to prevent hypoglycemia.   - Pair carbohydrate source with protein source to control blood sugar levels.   - Aim to increase physical activity to at least 30 minutes/day, 5 days/week and increase as tolerated.

## 2017-01-24 ENCOUNTER — Ambulatory Visit: Payer: 59 | Admitting: Registered"

## 2017-02-14 ENCOUNTER — Encounter: Payer: Self-pay | Admitting: Registered"

## 2017-02-14 ENCOUNTER — Encounter: Payer: 59 | Attending: Internal Medicine | Admitting: Registered"

## 2017-02-14 DIAGNOSIS — R7303 Prediabetes: Secondary | ICD-10-CM | POA: Diagnosis present

## 2017-02-14 DIAGNOSIS — Z713 Dietary counseling and surveillance: Secondary | ICD-10-CM | POA: Diagnosis not present

## 2017-02-14 DIAGNOSIS — E162 Hypoglycemia, unspecified: Secondary | ICD-10-CM

## 2017-02-14 NOTE — Progress Notes (Signed)
Medical Nutrition Therapy:  Appt start time: 10:30 end time:  11:05  Assessment:  Primary concerns today: Pt states her weight and A1c are going up. Per referral labs, A1c was 5.9 in 2017. Pt states in Jan she weighed 185 lb, now 175 lb. Pt reports she felt better when she was at 150 lbs.   Pt arrives having maintained weight from previous visit. Pt states she is in the process of finding a new PCP. Pt states her plan is to get an updated A1c to see what her value is and will continue nutrition visits with [place] until then. Pt states most recent A1C in Feb 2018 was 5.8. Pt states sometimes she has a sweet tooth and likes pastries. Pt states she is lactose intolerant. Pt is interested in losing weight and wants to prevent type 2 diabetes. Pt states her dad had diabetes and thinks he died of diabetic complications. Pt states she wants education on diabetes and how to prevent it. Pt states she needs accountability and to be managed.   Pt states she has low energy and doesn't like that she gets out of breath when when climbing stairs, which she didn't feel when she weighed 150 lbs.   Pt reports sometimes she feels low blood sugar symptoms (shakey) 2 hours after eating and will treat with apple juice or candy.   Pt states she wants help with strategies to have healthier habits when traveling. Pt states traveling is usually on holidays or long weekends. Pt also states she has a hard time getting back into her healthy routines after returning home from traveling. This will be the focus for a future visit.  Pt states some foods make her extremely bloated, such as broccoli and cabbage.   Pt states she would like regular RD visits to get her on track and pt verbalizes understanding that my focus would not be weight loss, rather other measures of increased health.  Work: 45-50 hours/wk Sleep: 3-4 hours last night, Awake at 2 am . Avg 6.5 hrs. Stress: somewhat increased due to preparing to move into a new  home  Preferred Learning Style:   No preference indicated   Learning Readiness:   Change in progress  MEDICATIONS: reviewed. Was taking 5,000 iu of vit D but stopped, will start taking 2,000   DIETARY INTAKE:  24-hr recall:  B ( AM): steamed vegetable medley, chicken (without skin) Snk ( AM): banana and nuts  L ( PM): open burger, small salad or baked salmon, vegetables  Snk (3 PM): fruit OR nuts OR fruit and protein shake if really hungry D ( PM):  Apple, vegetable medley, chicken Snk ( PM): cup of plain yogurt Beverages: water, 8 oz apple juice for low blood sugar, hot chocolate (when cold), 1 c of almond milk  Usual physical activity: walking 30 min, 5 days/week  Estimated energy needs: 1800 calories 200 g carbohydrates 135 g protein 50 g fat  Progress Towards Goal(s):  In progress.   Nutritional Diagnosis:  NI-5.8.4 Inconsistent carbohydrate intake As related to excessive carbohydrates to treat low blood sugar and only fruit for snacks.  As evidenced by dietary recall and elevated A1c.    Intervention:  Nutrition education for managing blood glucose with diet and lifestyle changes. RD educated pt on different carbohydrate sources and how to manage them throughout the day.  Goals: - Increase physical activity to at least 45-60 min, 5 days/week. - Substitute whole grain options for pasta, rice, bread, cereal, etc.  -  Track food and drink intake along with physical activity with MyFitnessPal.  - Choose unsaturated fats such as canola and olive oils.   Teaching Method Utilized:  Visual Auditory  Handouts given during visit include:  none   Barriers to learning/adherence to lifestyle change: none  Demonstrated degree of understanding via:  Teach Back   Monitoring/Evaluation:  Dietary intake, exercise, bloating symptoms, and body weight in 2 week(s).

## 2017-02-14 NOTE — Patient Instructions (Signed)
-   Increase physical activity to at least 45-60 min, 5 days/week.  - Substitute whole grain options for pasta, rice, bread, cereal, etc.   - Track food and drink intake along with physical activity with MyFitnessPal.   - Choose unsaturated fats such as canola and olive oils.

## 2017-03-07 ENCOUNTER — Ambulatory Visit: Payer: 59 | Admitting: Registered"

## 2017-03-21 ENCOUNTER — Ambulatory Visit: Payer: 59 | Admitting: Registered"

## 2017-04-03 ENCOUNTER — Ambulatory Visit: Payer: 59 | Admitting: Registered"

## 2017-08-01 ENCOUNTER — Ambulatory Visit (INDEPENDENT_AMBULATORY_CARE_PROVIDER_SITE_OTHER): Payer: 59 | Admitting: Neurology

## 2017-08-01 ENCOUNTER — Encounter: Payer: Self-pay | Admitting: Neurology

## 2017-08-01 VITALS — BP 135/92 | HR 77 | Wt 177.0 lb

## 2017-08-01 DIAGNOSIS — R519 Headache, unspecified: Secondary | ICD-10-CM

## 2017-08-01 DIAGNOSIS — R51 Headache: Secondary | ICD-10-CM | POA: Diagnosis not present

## 2017-08-01 NOTE — Progress Notes (Signed)
PATIENT: Theresa Bradford DOB: Jul 10, 1960  Chief Complaint  Patient presents with  . New Patient (Initial Visit)    PCP: Neldon Labella, PA. Patient reports that she gets frequent sinus infections. She also has a lot of dizziness and headaches, she doesn't believe that memory is an issue.      HISTORICAL  Theresa Bradford is a 57 year old female, seen in refer by her primary care PA [place], Misty Stanley for evaluation of dizziness, frequent headaches, also memory issues, initial evaluation was on August 01, 2017.  Reviewed and summarized the referring note, she had a history of prediabetes, hypertension, vitamin D deficiency, hyperlipidemia, she works as Airline pilot, under a lot of stress in recent few months.  She has a long history of migraine headaches, her typical migraine are retro-orbital area or lateralized severe pounding headache with associated light noise sensitivity.  She used to have frequent migraine headaches pre-menopause,  Since 2017, she began to have frequent headaches, bilateral retro-orbital area facial pressure, achy pain, she also suffered allergy, allergic to multiple plants all year round, her allergy symptoms has improved some with Zyrtec, but she continue have almost daily headaches, especially at the end of the day, sometimes woke her up from sleep with severe pounding headaches, Tylenol helps her headache some, but cause drowsiness, she tried to avoid frequent medication use.  She could not tolerate beta-blocker, cause difficulty breathing,  In addition, she complains of dizziness, it is not vertigo, more of brain foggy sensation  Laboratory evaluations in January 2019, normal CBC hemoglobin of 13.1, CMP, creatinine of 1.0, LDL was 114, cholesterol was 204, vitamin D was mildly decreased 24, A1c was 6.1, free T4 was 0.92, T3 was 94, folic acid was 18, vitamin Z61 685,  REVIEW OF SYSTEMS: Full 14 system review of systems performed and notable only for blurred vision,  headaches, dizziness, sleepiness, anxiety  ALLERGIES: Allergies  Allergen Reactions  . Caffeine Palpitations  . Ibuprofen Palpitations  . Other Shortness Of Breath    Beta blockers cause respiratory issues.   . Latex Itching and Rash    HOME MEDICATIONS: Current Outpatient Medications  Medication Sig Dispense Refill  . cetirizine (ZYRTEC) 10 MG tablet Take 10 mg by mouth daily.    . clonazePAM (KLONOPIN) 0.5 MG tablet Take 0.25-0.5 mg by mouth daily as needed for anxiety.   1  . diltiazem (DILACOR XR) 120 MG 24 hr capsule Take 120 mg by mouth daily.    . ergocalciferol (VITAMIN D2) 50000 units capsule Take 50,000 Units by mouth once a week.     No current facility-administered medications for this visit.     PAST MEDICAL HISTORY: Past Medical History:  Diagnosis Date  . Frequent sinus infections   . Hypercholesteremia   . Hypertension   . Menopause   . Prediabetes   . Vitamin D deficiency     PAST SURGICAL HISTORY: Past Surgical History:  Procedure Laterality Date  . BREAST REDUCTION SURGERY  2008  . CESAREAN SECTION    . LAPAROSCOPIC APPENDECTOMY N/A 07/28/2016   Procedure: APPENDECTOMY LAPAROSCOPIC;  Surgeon: Berna Bue, MD;  Location: [facility] OR;  Service: General;  Laterality: N/A;  . TUBAL LIGATION    . tummy tuck      FAMILY HISTORY: Family History  Problem Relation Age of Onset  . Anxiety disorder Mother   . Hypertension Mother   . Hypertension Father   . Diabetes Father     SOCIAL HISTORY:  Social History   Socioeconomic  History  . Marital status: Single    Spouse name: Not on file  . Number of children: Not on file  . Years of education: Not on file  . Highest education level: Not on file  Social Needs  . Financial resource strain: Not on file  . Food insecurity - worry: Not on file  . Food insecurity - inability: Not on file  . Transportation needs - medical: Not on file  . Transportation needs - non-medical: Not on file  Occupational  History  . Not on file  Tobacco Use  . Smoking status: Never Smoker  . Smokeless tobacco: Never Used  Substance and Sexual Activity  . Alcohol use: No  . Drug use: No  . Sexual activity: Not on file  Other Topics Concern  . Not on file  Social History Narrative  . Not on file     PHYSICAL EXAM   Vitals:   08/01/17 1018  BP: (!) 135/92  Pulse: 77  Weight: 177 lb (80.3 kg)    Not recorded      Body mass index is 29.45 kg/m.  PHYSICAL EXAMNIATION:  Gen: NAD, conversant, well nourised, obese, well groomed                     Cardiovascular: Regular rate rhythm, no peripheral edema, warm, nontender. Eyes: Conjunctivae clear without exudates or hemorrhage Neck: Supple, no carotid bruits. Pulmonary: Clear to auscultation bilaterally   NEUROLOGICAL EXAM:  MENTAL STATUS: Speech:    Speech is normal; fluent and spontaneous with normal comprehension.  Cognition:     Orientation to time, place and person     Normal recent and remote memory     Normal Attention span and concentration     Normal Language, naming, repeating,spontaneous speech     Fund of knowledge   CRANIAL NERVES: CN II: Visual fields are full to confrontation. Fundoscopic exam is normal with sharp discs and no vascular changes. Pupils are round equal and briskly reactive to light. CN III, IV, VI: extraocular movement are normal. No ptosis. CN V: Facial sensation is intact to pinprick in all 3 divisions bilaterally. Corneal responses are intact.  CN VII: Face is symmetric with normal eye closure and smile. CN VIII: Hearing is normal to rubbing fingers CN IX, X: Palate elevates symmetrically. Phonation is normal. CN XI: Head turning and shoulder shrug are intact CN XII: Tongue is midline with normal movements and no atrophy.  MOTOR: There is no pronator drift of out-stretched arms. Muscle bulk and tone are normal. Muscle strength is normal.  REFLEXES: Reflexes are 2+ and symmetric at the biceps,  triceps, knees, and ankles. Plantar responses are flexor.  SENSORY: Intact to light touch, pinprick, positional sensation and vibratory sensation are intact in fingers and toes.  COORDINATION: Rapid alternating movements and fine finger movements are intact. There is no dysmetria on finger-to-nose and heel-knee-shin.    GAIT/STANCE: Posture is normal. Gait is steady with normal steps, base, arm swing, and turning. Heel and toe walking are normal. Tandem gait is normal.  Romberg is absent.   DIAGNOSTIC DATA (LABS, IMAGING, TESTING) - I reviewed patient records, labs, notes, testing and imaging myself where available.   ASSESSMENT AND PLAN  Arina Torry is a 57 y.o. female   Persistent headaches, Dizziness  She does have a history of migraine headaches, her current complaints of persistent headache has some migraine features,  MRI of the brain to rule out structural lesion,  NSAIDs  as needed for moderate to severe headaches,   Levert FeinsteinYijun Olden Klauer, M.D. Ph.D.  [facility] [address], [address] [place], [place] [address] Ph: [phone] Fax: [phone]  CC: Neldon LabellaWingate, Stacey, [place]

## 2017-08-04 ENCOUNTER — Telehealth: Payer: Self-pay | Admitting: Neurology

## 2017-08-04 NOTE — Telephone Encounter (Signed)
[facility]. She has Monia Pouchetna they obtain the authorization.

## 2017-08-12 ENCOUNTER — Ambulatory Visit
Admission: RE | Admit: 2017-08-12 | Discharge: 2017-08-12 | Disposition: A | Discharge status: Home / Self Care | Payer: 59 | Source: Ambulatory Visit | Attending: Neurology | Admitting: Neurology

## 2017-08-12 DIAGNOSIS — R519 Headache, unspecified: Secondary | ICD-10-CM

## 2017-08-12 DIAGNOSIS — R51 Headache: Principal | ICD-10-CM

## 2017-08-14 ENCOUNTER — Telehealth: Payer: Self-pay | Admitting: Neurology

## 2017-08-14 NOTE — Telephone Encounter (Signed)
Please call patient, MRI of the brain showed scattered subcortical small vessel disease, most likely related to her migraines, I will review films with her at next follow-up visit.   MRI brain (without) demonstrating: 1. Scattered subcortical and juxtacortical foci of T2 hyperintensities. These findings are non-specific and considerations include autoimmune, inflammatory, post-infectious, microvascular ischemic or migraine associated etiologies.  2. No acute findings.

## 2017-08-15 NOTE — Telephone Encounter (Signed)
I called and spoke to pt and relayed that her MRI showed some specks of scar tissue/ foci that most likely related to her migraines, could be SVD (risk factors of hypertension, high cholesterol, diabetes), sinuses unremarkable (pt asking about).  No acute findings.  Will review next visit 10-02-17.  She verbalized understanding.

## 2017-09-16 ENCOUNTER — Emergency Department (HOSPITAL_COMMUNITY): Payer: 59

## 2017-09-16 ENCOUNTER — Other Ambulatory Visit: Payer: Self-pay

## 2017-09-16 ENCOUNTER — Emergency Department (HOSPITAL_COMMUNITY)
Admission: EM | Admit: 2017-09-16 | Discharge: 2017-09-16 | Disposition: A | Discharge status: Home / Self Care | Payer: 59 | Attending: Emergency Medicine | Admitting: Emergency Medicine

## 2017-09-16 ENCOUNTER — Encounter (HOSPITAL_COMMUNITY): Payer: Self-pay | Admitting: Emergency Medicine

## 2017-09-16 DIAGNOSIS — R0789 Other chest pain: Secondary | ICD-10-CM

## 2017-09-16 DIAGNOSIS — Z91040 Latex allergy status: Secondary | ICD-10-CM | POA: Diagnosis not present

## 2017-09-16 DIAGNOSIS — Z79899 Other long term (current) drug therapy: Secondary | ICD-10-CM | POA: Insufficient documentation

## 2017-09-16 DIAGNOSIS — I10 Essential (primary) hypertension: Secondary | ICD-10-CM

## 2017-09-16 LAB — BASIC METABOLIC PANEL
Anion gap: 9 (ref 5–15)
BUN: 8 mg/dL (ref 6–20)
CALCIUM: 9.2 mg/dL (ref 8.9–10.3)
CO2: 26 mmol/L (ref 22–32)
Chloride: 107 mmol/L (ref 101–111)
Creatinine, Ser: 0.73 mg/dL (ref 0.44–1.00)
GFR calc non Af Amer: >60 mL/min (ref >60)
GLUCOSE: 118 mg/dL — AB (ref 65–99)
Potassium: 3.8 mmol/L (ref 3.5–5.1)
Sodium: 142 mmol/L (ref 135–145)

## 2017-09-16 LAB — CBC
HCT: 39.9 % (ref 36.0–46.0)
Hemoglobin: 12.9 g/dL (ref 12.0–15.0)
MCH: 28.8 pg (ref 26.0–34.0)
MCHC: 32.3 g/dL (ref 30.0–36.0)
MCV: 89.1 fL (ref 78.0–100.0)
PLATELETS: 290 10*3/uL (ref 150–400)
RBC: 4.48 MIL/uL (ref 3.87–5.11)
RDW: 13.7 % (ref 11.5–15.5)
WBC: 8.2 10*3/uL (ref 4.0–10.5)

## 2017-09-16 LAB — I-STAT TROPONIN, ED: TROPONIN I, POC: 0 ng/mL (ref 0.00–0.08)

## 2017-09-16 LAB — URINALYSIS, ROUTINE W REFLEX MICROSCOPIC
BILIRUBIN URINE: NEGATIVE
Bacteria, UA: NONE SEEN
GLUCOSE, UA: NEGATIVE mg/dL
KETONES UR: NEGATIVE mg/dL
LEUKOCYTES UA: NEGATIVE
NITRITE: NEGATIVE
PH: 8 (ref 5.0–8.0)
Protein, ur: NEGATIVE mg/dL
SPECIFIC GRAVITY, URINE: 1.005 (ref 1.005–1.030)

## 2017-09-16 LAB — I-STAT BETA HCG BLOOD, ED (MC, WL, AP ONLY): I-stat hCG, quantitative: <5 m[IU]/mL (ref <5)

## 2017-09-16 NOTE — ED Notes (Signed)
Pt reports her CP has almost completely resolved. States she took a few aspirin which has helped.

## 2017-09-16 NOTE — ED Provider Notes (Signed)
[facility] Provider Note   CSN: [id number] Arrival date & time: 09/16/17  1216     History   Chief Complaint Chief Complaint  Patient presents with  . Chest Pain  . Hypertension    HPI Theresa Bradford is a 57 y.o. female presenting for evaluation of high blood pressure and chest pain.  Patient states for the past several weeks, she has been having fluctuating blood pressure.  She states the highest it has been has been 180 systolic.  She is on diltiazem 120 mg, but she had some leftover 180 mg pills, which she is started taking instead of the 120.  She is not on anything else for blood pressure.  She states that this morning she felt a chest pain/pressure.  It is intermittent, nothing makes it better or worse.  It is not worse with exertion.  No associated shortness of breath, nausea, vomiting, or diaphoresis.  She denies vision changes, slurred speech, weakness, numbness, abdominal pain.,  Or abnormal bowel movements.  She denies prior cardiac history.  She has an appointment with her primary care doctor scheduled for next week.  She does not smoke cigarettes, denies alcohol use, denies drug use.  She reports increased stress and anxiety recently.  Additionally, patient reports she has been having urinary frequency, denies dysuria or hematuria. Chest pain has resolved while waiting in the ER.  Patient currently without any complaints.  HPI  Past Medical History:  Diagnosis Date  . Frequent sinus infections   . Hypercholesteremia   . Hypertension   . Menopause   . Prediabetes   . Vitamin D deficiency     Patient Active Problem List   Diagnosis Date Noted  . Nonintractable headache 08/01/2017  . Appendicitis 07/28/2016    Past Surgical History:  Procedure Laterality Date  . BREAST REDUCTION SURGERY  2008  . CESAREAN SECTION    . LAPAROSCOPIC APPENDECTOMY N/A 07/28/2016   Procedure: APPENDECTOMY LAPAROSCOPIC;  Surgeon: Berna Buehelsea A Connor,  MD;  Location: [facility] OR;  Service: General;  Laterality: N/A;  . TUBAL LIGATION    . tummy tuck       OB History   None      Home Medications    Prior to Admission medications   Medication Sig Start Date End Date Taking? Authorizing Provider  aspirin 81 MG chewable tablet Chew 162 mg by mouth once as needed (for sudden onset of chest pain).    Yes [provider]  cetirizine (ZYRTEC) 10 MG tablet Take 10 mg by mouth daily.   Yes [provider]  CHROMIUM PO Take 1 capsule by mouth daily.   Yes [provider]  clonazePAM (KLONOPIN) 0.5 MG tablet Take 0.25-0.5 mg by mouth daily as needed for anxiety.  06/08/15  Yes [provider]  diltiazem (CARDIZEM CD) 180 MG 24 hr capsule Take 180 mg by mouth daily. 09/14/17  Yes [provider]    Family History Family History  Problem Relation Age of Onset  . Anxiety disorder Mother   . Hypertension Mother   . Hypertension Father   . Diabetes Father     Social History Social History   Tobacco Use  . Smoking status: Never Smoker  . Smokeless tobacco: Never Used  Substance Use Topics  . Alcohol use: No  . Drug use: No     Allergies   Beta adrenergic blockers; Caffeine; Ibuprofen; Other; Adhesive [tape]; Latex; and Macrobid [nitrofurantoin]   Review of Systems  Review of Systems  Cardiovascular: Positive for chest pain (Resolved).  Genitourinary: Positive for frequency.  All other systems reviewed and are negative.    Physical Exam Updated Vital Signs BP 138/83   Pulse 75   Temp 98.6 F (37 C) (Oral)   Resp 18   SpO2 94%   Physical Exam  Constitutional: She is oriented to person, place, and time. She appears well-developed and well-nourished. No distress.  Sitting comfortably in bed in no apparent distress.  HENT:  Head: Normocephalic and atraumatic.  Right Ear: Tympanic membrane, external ear and ear canal normal.  Left Ear: Tympanic membrane, external ear and ear canal  normal.  Nose: Nose normal.  Mouth/Throat: Uvula is midline, oropharynx is clear and moist and mucous membranes are normal.  Eyes: Pupils are equal, round, and reactive to light. Conjunctivae and EOM are normal.  Neck: Normal range of motion. Neck supple.  Cardiovascular: Normal rate, regular rhythm and intact distal pulses.  Pulmonary/Chest: Effort normal and breath sounds normal. No respiratory distress. She has no wheezes. She exhibits no tenderness.  Abdominal: Soft. She exhibits no distension and no mass. There is no tenderness. There is no guarding.  Musculoskeletal: Normal range of motion.  Strength intact x4.  Sensation intact x4.  Radial and pedal pulses equal bilaterally.  Patient ambulatory.  Neurological: She is alert and oriented to person, place, and time. She has normal strength. No cranial nerve deficit or sensory deficit. Coordination and gait normal. GCS eye subscore is 4. GCS verbal subscore is 5. GCS motor subscore is 6.  No obvious neurologic deficits.  Nose to finger intact.  CN intact.  Skin: Skin is warm and dry.  Psychiatric: She has a normal mood and affect.  Nursing note and vitals reviewed.    ED Treatments / Results  Labs (all labs ordered are listed, but only abnormal results are displayed) Labs Reviewed  BASIC METABOLIC PANEL - Abnormal; Notable for the following components:      Result Value   Glucose, Bld 118 (*)    All other components within normal limits  URINALYSIS, ROUTINE W REFLEX MICROSCOPIC - Abnormal; Notable for the following components:   Color, Urine STRAW (*)    Hgb urine dipstick SMALL (*)    All other components within normal limits  CBC  I-STAT TROPONIN, ED  I-STAT BETA HCG BLOOD, ED ([facility], [facility], [facility] ONLY)    EKG EKG Interpretation  Date/Time:  Tuesday September 16 2017 12:31:33 EDT Ventricular Rate:  73 PR Interval:  194 QRS Duration: 86 QT Interval:  388 QTC Calculation: 427 R Axis:   76 Text Interpretation:  Normal sinus rhythm  Normal ECG No significant change since last tracing Confirmed by Frederick Peers [phone]) on 09/16/2017 2:53:41 PM   Radiology Dg Chest 2 View  Result Date: 09/16/2017 CLINICAL DATA:  Chest discomfort with variable blood pressure for 2 weeks. EXAM: CHEST - 2 VIEW COMPARISON:  01/19/2016 FINDINGS: Artifact from EKG leads. Normal heart size and mediastinal contours. No acute infiltrate or edema. No effusion or pneumothorax. No acute osseous findings. IMPRESSION: No evidence of active disease. Electronically Signed   By: Marnee Spring M.D.   On: 09/16/2017 13:24    Procedures Procedures (including critical care time)  Medications Ordered in ED Medications - No data to display   Initial Impression / Assessment and Plan / ED Course  I have reviewed the triage vital signs and the nursing notes.  Pertinent labs & imaging results that were available during  my care of the patient were reviewed by me and considered in my medical decision making (see chart for details).     Patient presenting for evaluation of high blood pressure and chest pain.  Physical exam reassuring, pain has resolved, blood pressure improving.  Labs reassuring, creatinine stable, lecture lites stable, troponin negative.  EKG unchanged from prior.  Chest x-ray viewed and interpreted by me, no sign of infection.  At this time, doubt endorgan damage.  No neurologic deficits, doubt CVA.  Discussed findings with patient.  Discussed management of blood pressure by PCP.  Discussed avoiding caffeine, NSAIDs, and making sure she is hydrated.  Will obtain UA due to urinary symptoms.  Urine negative for infection.  At this time, patient appears safe for discharge.  Discussed importance of follow-up with primary care.  Return cautions given.  Patient states she understands and agrees to plan.  Final Clinical Impressions(s) / ED Diagnoses   Final diagnoses:  Hypertension, unspecified type  Atypical chest pain    ED Discharge Orders      None       Alveria Apley, PA-C 09/16/17 1754    Little, Ambrose Finland, MD 09/23/17 [phone]

## 2017-09-16 NOTE — Discharge Instructions (Addendum)
Continue taking your medications as prescribed. Make sure you are staying well-hydrated with water. Avoid anti-inflammatories such as Advil, ibuprofen, and Aleve, as these can increase your blood pressure. Follow up with your primary care doctor for further evaluation of your symptoms and blood pressure. Return to the emergency room if you develop persistent chest pain, difficulty breathing, numbness, slurred speech, or any new or concerning symptoms.

## 2017-09-16 NOTE — ED Triage Notes (Signed)
Pt to ER for evaluation of central chest tightness and hypertension that began intermittently 2 weeks ago but has been progressively getting worse. Reports hx of HTN and is compliant with medications. NAD at this time.

## 2017-10-02 ENCOUNTER — Ambulatory Visit: Payer: 59 | Admitting: Neurology

## 2017-10-02 ENCOUNTER — Telehealth: Payer: Self-pay | Admitting: *Deleted

## 2017-10-02 NOTE — Telephone Encounter (Signed)
No showed follow up appointment. 

## 2017-10-06 ENCOUNTER — Encounter: Payer: Self-pay | Admitting: Neurology

## 2018-02-25 IMAGING — CR DG CHEST 2V
2 series · 2 of 2 positions shown · non-contrast
Comparison: Chest radiograph 11/20/2015

CLINICAL DATA: Cough and congestion since returning from [place].

EXAM:
CHEST  2 VIEW

[chest pa]
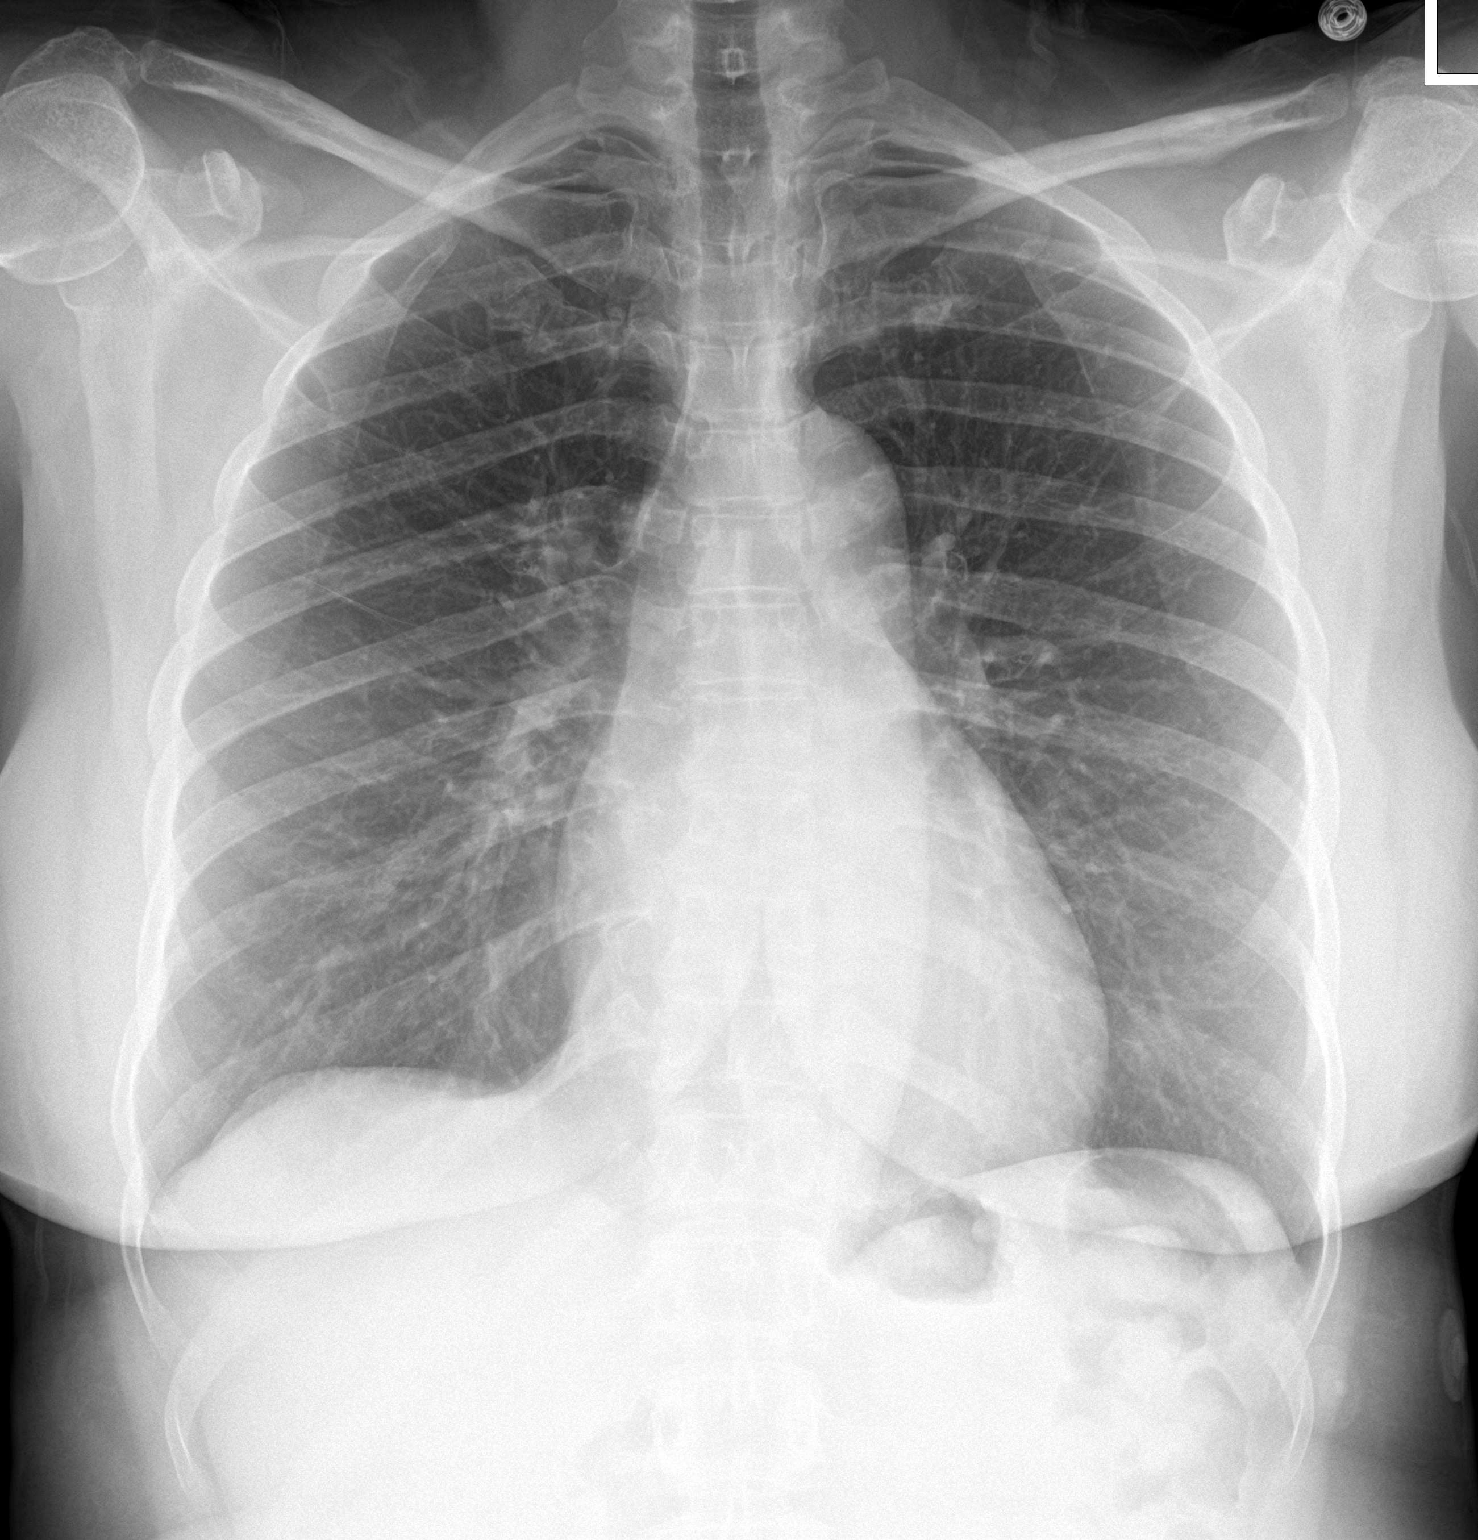

[chest lat]
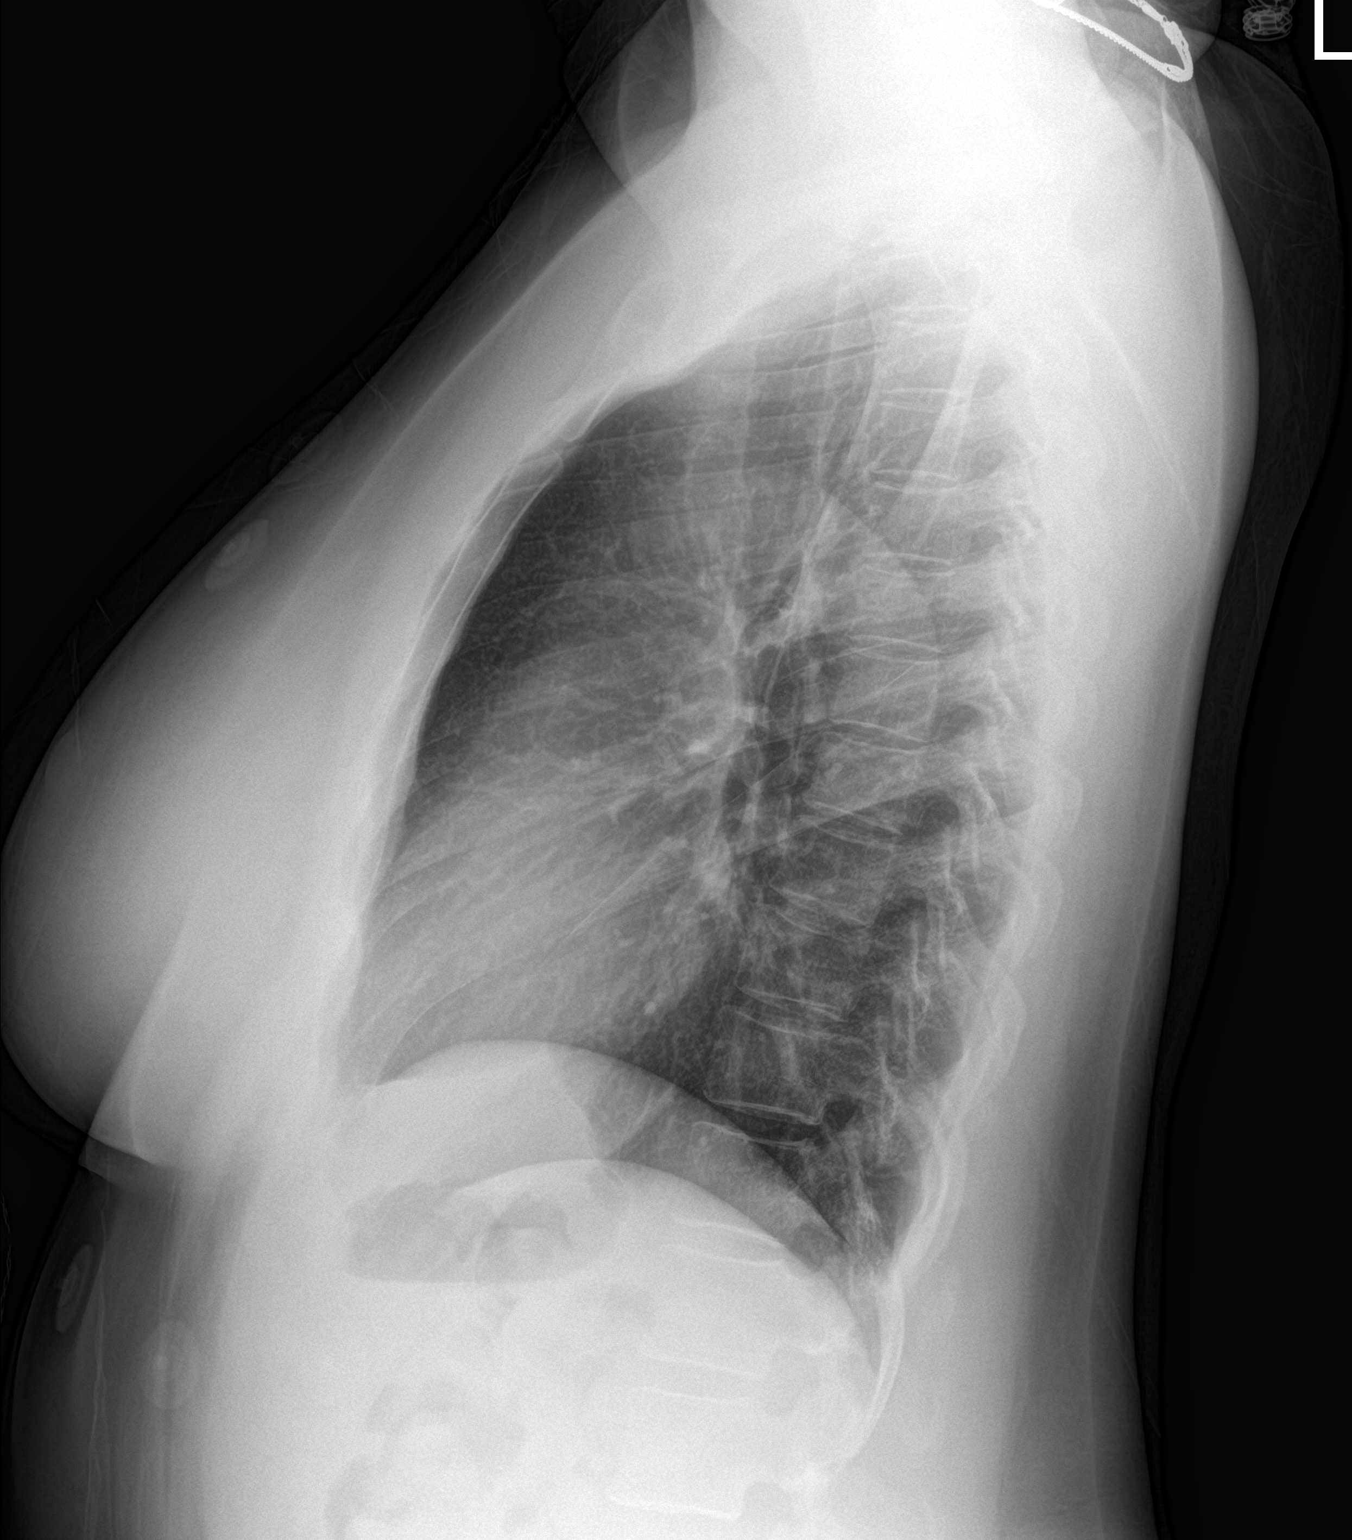

[2 of 2 positions shown; findings below may reference images not displayed]

FINDINGS: Cardiomediastinal contours are normal. No pneumothorax or sizable
pleural effusion.

No focal airspace consolidation or pulmonary edema.
IMPRESSION: Clear lungs.

## 2018-03-02 ENCOUNTER — Other Ambulatory Visit: Payer: Self-pay

## 2018-03-02 ENCOUNTER — Encounter (HOSPITAL_COMMUNITY): Payer: Self-pay

## 2018-03-02 ENCOUNTER — Emergency Department (HOSPITAL_COMMUNITY): Payer: 59

## 2018-03-02 ENCOUNTER — Ambulatory Visit (INDEPENDENT_AMBULATORY_CARE_PROVIDER_SITE_OTHER)
Admission: EM | Admit: 2018-03-02 | Discharge: 2018-03-02 | Disposition: A | Discharge status: Home / Self Care | Payer: 59 | Source: Home / Self Care

## 2018-03-02 ENCOUNTER — Encounter (HOSPITAL_COMMUNITY): Payer: Self-pay | Admitting: Emergency Medicine

## 2018-03-02 ENCOUNTER — Emergency Department (HOSPITAL_COMMUNITY)
Admission: EM | Admit: 2018-03-02 | Discharge: 2018-03-02 | Disposition: A | Discharge status: Home / Self Care | Payer: 59 | Attending: Emergency Medicine | Admitting: Emergency Medicine

## 2018-03-02 DIAGNOSIS — R1084 Generalized abdominal pain: Secondary | ICD-10-CM

## 2018-03-02 DIAGNOSIS — I10 Essential (primary) hypertension: Secondary | ICD-10-CM | POA: Insufficient documentation

## 2018-03-02 DIAGNOSIS — K8020 Calculus of gallbladder without cholecystitis without obstruction: Secondary | ICD-10-CM | POA: Insufficient documentation

## 2018-03-02 DIAGNOSIS — Z886 Allergy status to analgesic agent status: Secondary | ICD-10-CM | POA: Insufficient documentation

## 2018-03-02 DIAGNOSIS — Z79899 Other long term (current) drug therapy: Secondary | ICD-10-CM

## 2018-03-02 DIAGNOSIS — Z7982 Long term (current) use of aspirin: Secondary | ICD-10-CM | POA: Insufficient documentation

## 2018-03-02 DIAGNOSIS — Z881 Allergy status to other antibiotic agents status: Secondary | ICD-10-CM

## 2018-03-02 DIAGNOSIS — Z888 Allergy status to other drugs, medicaments and biological substances status: Secondary | ICD-10-CM

## 2018-03-02 DIAGNOSIS — R140 Abdominal distension (gaseous): Secondary | ICD-10-CM

## 2018-03-02 DIAGNOSIS — R109 Unspecified abdominal pain: Secondary | ICD-10-CM | POA: Diagnosis present

## 2018-03-02 LAB — COMPREHENSIVE METABOLIC PANEL
ALBUMIN: 3.8 g/dL (ref 3.5–5.0)
ALT: 17 U/L (ref 0–44)
ANION GAP: 7 (ref 5–15)
AST: 20 U/L (ref 15–41)
Alkaline Phosphatase: 61 U/L (ref 38–126)
BUN: 10 mg/dL (ref 6–20)
CO2: 28 mmol/L (ref 22–32)
Calcium: 9.1 mg/dL (ref 8.9–10.3)
Chloride: 104 mmol/L (ref 98–111)
Creatinine, Ser: 0.76 mg/dL (ref 0.44–1.00)
GFR calc Af Amer: >60 mL/min (ref >60)
GFR calc non Af Amer: >60 mL/min (ref >60)
GLUCOSE: 89 mg/dL (ref 70–99)
POTASSIUM: 3.3 mmol/L — AB (ref 3.5–5.1)
SODIUM: 139 mmol/L (ref 135–145)
TOTAL PROTEIN: 7.3 g/dL (ref 6.5–8.1)
Total Bilirubin: 0.4 mg/dL (ref 0.3–1.2)

## 2018-03-02 LAB — URINALYSIS, ROUTINE W REFLEX MICROSCOPIC
BACTERIA UA: NONE SEEN
Bilirubin Urine: NEGATIVE
GLUCOSE, UA: NEGATIVE mg/dL
Ketones, ur: NEGATIVE mg/dL
Leukocytes, UA: NEGATIVE
NITRITE: NEGATIVE
PH: 6 (ref 5.0–8.0)
PROTEIN: NEGATIVE mg/dL
SPECIFIC GRAVITY, URINE: 1.004 — AB (ref 1.005–1.030)

## 2018-03-02 LAB — POCT URINALYSIS DIP (DEVICE)
Bilirubin Urine: NEGATIVE
GLUCOSE, UA: NEGATIVE mg/dL
Ketones, ur: NEGATIVE mg/dL
LEUKOCYTES UA: NEGATIVE
NITRITE: NEGATIVE
Protein, ur: NEGATIVE mg/dL
Specific Gravity, Urine: <=1.005 (ref 1.005–1.030)
UROBILINOGEN UA: 0.2 mg/dL (ref 0.0–1.0)
pH: 7 (ref 5.0–8.0)

## 2018-03-02 LAB — CBC
HEMATOCRIT: 40.5 % (ref 36.0–46.0)
HEMOGLOBIN: 12.6 g/dL (ref 12.0–15.0)
MCH: 28.4 pg (ref 26.0–34.0)
MCHC: 31.1 g/dL (ref 30.0–36.0)
MCV: 91.4 fL (ref 78.0–100.0)
Platelets: 258 10*3/uL (ref 150–400)
RBC: 4.43 MIL/uL (ref 3.87–5.11)
RDW: 13.2 % (ref 11.5–15.5)
WBC: 8.1 10*3/uL (ref 4.0–10.5)

## 2018-03-02 LAB — LIPASE, BLOOD: Lipase: 22 U/L (ref 11–51)

## 2018-03-02 MED ORDER — OMEPRAZOLE 20 MG PO CPDR: 20.0000 mg | DELAYED_RELEASE_CAPSULE | Freq: Every day | ORAL | 0 refills | Status: DC

## 2018-03-02 MED ORDER — ONDANSETRON HCL 4 MG/2ML IJ SOLN: 4.0000 mg | Freq: Once | INTRAMUSCULAR | Status: DC

## 2018-03-02 MED ORDER — HYDROMORPHONE HCL 1 MG/ML IJ SOLN: 0.5000 mg | Freq: Once | INTRAMUSCULAR | Status: DC

## 2018-03-02 MED ORDER — GI COCKTAIL ~~LOC~~
30.0000 mL | Freq: Once | ORAL | Status: AC
  Administered 2018-03-02: 30 mL via ORAL
  Filled 2018-03-02: qty 30

## 2018-03-02 NOTE — ED Triage Notes (Signed)
Pt states she has been having stomach pain 2 months now.

## 2018-03-02 NOTE — ED Provider Notes (Signed)
[facility]    CSN: [id number] Arrival date & time: 03/02/18  1436     History   Chief Complaint Chief Complaint  Patient presents with  . Abdominal Pain    HPI Theresa Bradford is a 57 y.o. female.   Theresa Bradford presents with complaints of abdominal pain and bloating which started 2 months ago and has been worsening. States anything she eats makes her stomach feel bloated which causes pain. Her clothing feels tight. At times she feels shortness of breath related to the significant bloating. No fevers. No nausea or vomiting. Has normal bm's, three bm's today.  Not loose. No blood to stool. Denies urinary symptoms. Feels fatigued. Has tried changing her diet which has minimally helped. Feels that eliminating dairy may have helped some. States pain feels somewhat similar to when she had an appendicitis. Has not taken any medications for symptoms. Hx of htn, prediabetes, sinus infection, appendectomy, tubal ligation.    ROS per HPI.      Past Medical History:  Diagnosis Date  . Frequent sinus infections   . Hypercholesteremia   . Hypertension   . Menopause   . Prediabetes   . Vitamin D deficiency     Patient Active Problem List   Diagnosis Date Noted  . Nonintractable headache 08/01/2017  . Appendicitis 07/28/2016    Past Surgical History:  Procedure Laterality Date  . BREAST REDUCTION SURGERY  2008  . CESAREAN SECTION    . LAPAROSCOPIC APPENDECTOMY N/A 07/28/2016   Procedure: APPENDECTOMY LAPAROSCOPIC;  Surgeon: Berna Bue, MD;  Location: [facility] OR;  Service: General;  Laterality: N/A;  . TUBAL LIGATION    . tummy tuck      OB History   None      Home Medications    Prior to Admission medications   Medication Sig Start Date End Date Taking? Authorizing Provider  aspirin 81 MG chewable tablet Chew 162 mg by mouth once as needed (for sudden onset of chest pain).     [provider]  cetirizine (ZYRTEC) 10 MG tablet Take 10 mg by mouth daily.     [provider]  CHROMIUM PO Take 1 capsule by mouth daily.    [provider]  clonazePAM (KLONOPIN) 0.5 MG tablet Take 0.25-0.5 mg by mouth daily as needed for anxiety.  06/08/15   [provider]  diltiazem (CARDIZEM CD) 180 MG 24 hr capsule Take 180 mg by mouth daily. 09/14/17   [provider]    Family History Family History  Problem Relation Age of Onset  . Anxiety disorder Mother   . Hypertension Mother   . Hypertension Father   . Diabetes Father     Social History Social History   Tobacco Use  . Smoking status: Never Smoker  . Smokeless tobacco: Never Used  Substance Use Topics  . Alcohol use: No  . Drug use: No     Allergies   Beta adrenergic blockers; Caffeine; Ibuprofen; Other; Adhesive [tape]; Latex; and Macrobid [nitrofurantoin]   Review of Systems Review of Systems   Physical Exam Triage Vital Signs ED Triage Vitals  Enc Vitals Group     BP 03/02/18 1533 (!) 156/90     Pulse Rate 03/02/18 1533 83     Resp 03/02/18 1533 18     Temp 03/02/18 1533 98.2 F (36.8 C)     Temp Source 03/02/18 1533 Oral     SpO2 03/02/18 1533 99 %     Weight 03/02/18  1535 170 lb (77.1 kg)     Height --      Head Circumference --      Peak Flow --      Pain Score 03/02/18 1533 7     Pain Loc --      Pain Edu? --      Excl. in GC? --    No data found.  Updated Vital Signs BP (!) 156/90 (BP Location: Right Arm)   Pulse 83   Temp 98.2 F (36.8 C) (Oral)   Resp 18   Wt 170 lb (77.1 kg)   SpO2 99%   BMI 28.29 kg/m    Physical Exam  Constitutional: She is oriented to person, place, and time. She appears well-developed and well-nourished. No distress.  Cardiovascular: Normal rate, regular rhythm and normal heart sounds.  Pulmonary/Chest: Effort normal and breath sounds normal.  Abdominal: Soft. Bowel sounds are decreased. There is no hepatosplenomegaly. There is generalized tenderness. There is no rigidity, no rebound, no  guarding, no CVA tenderness, no tenderness at McBurney's point and negative Murphy's sign. No hernia.  Neurological: She is alert and oriented to person, place, and time.  Skin: Skin is warm and dry.     UC Treatments / Results  Labs (all labs ordered are listed, but only abnormal results are displayed) Labs Reviewed  POCT URINALYSIS DIP (DEVICE) - Abnormal; Notable for the following components:      Result Value   Hgb urine dipstick TRACE (*)    All other components within normal limits  URINE CULTURE    EKG None  Radiology No results found.  Procedures Procedures (including critical care time)  Medications Ordered in UC Medications - No data to display  Initial Impression / Assessment and Plan / UC Course  I have reviewed the triage vital signs and the nursing notes.  Pertinent labs & imaging results that were available during my care of the patient were reviewed by me and considered in my medical decision making (see chart for details).     Afebrile, non toxic in appearance. Non specific abdominal findings on physical exam. Symptoms for 2 months. Patient states she is very concerned as when she had similar symptoms she had to have her appendix removed. Requests sonogram for further evaluation. Discussed with patient that this is unavailable here at [facility], we have xray, which would provide limited information with current symptoms. No fevers, no nausea or vomiting. Bloating and generalized abdominal pain. Stooling. Patient declines treatment plan here in UC stating she would like to go to the Er for further evaluation. Stable for self transport to Er.    Final Clinical Impressions(s) / UC Diagnoses   Final diagnoses:  Generalized abdominal pain   Discharge Instructions   None    ED Prescriptions    None     Controlled Substance Prescriptions [place] Controlled Substance Registry consulted? Not Applicable   Georgetta Haber, NP 03/02/18 1610

## 2018-03-02 NOTE — ED Notes (Signed)
Pt ambulatory to the restroom.  

## 2018-03-02 NOTE — ED Provider Notes (Signed)
[facility] EMERGENCY DEPARTMENT Provider Note   CSN: [id number] Arrival date & time: 03/02/18  1618     History   Chief Complaint Chief Complaint  Patient presents with  . Abdominal Pain    HPI Theresa Bradford is a 57 y.o. female.  HPI  57 year old female premeasuring for HTN, hyper cholesterolemia presents with chief complaint of abdominal pain.  Patient states that for the last 2 months she has had progressively worsening generalized abdominal pain.  Pain is generalized, 8/10 in severity, worse after meals, loosening her prescription makes it better, associating with abdominal bloating after meals, mild nausea without emesis, normal bowel movements.  LBM today x4, no diarrhea.  Denies melena.  Patient has been attempting to alter her diet to control symptoms with mild improvement after cutting out dairy.  Patient has cut out gluten.  Patient denies similar symptoms in the past.  Denies traumas or falls.  Denies infectious symptoms.  LMP was 20 years ago after tubal ligation.  Denies vaginal symptoms.  Past Medical History:  Diagnosis Date  . Frequent sinus infections   . Hypercholesteremia   . Hypertension   . Menopause   . Prediabetes   . Vitamin D deficiency     Patient Active Problem List   Diagnosis Date Noted  . Nonintractable headache 08/01/2017  . Appendicitis 07/28/2016    Past Surgical History:  Procedure Laterality Date  . BREAST REDUCTION SURGERY  2008  . CESAREAN SECTION    . LAPAROSCOPIC APPENDECTOMY N/A 07/28/2016   Procedure: APPENDECTOMY LAPAROSCOPIC;  Surgeon: Berna Bue, MD;  Location: [facility] OR;  Service: General;  Laterality: N/A;  . TUBAL LIGATION    . tummy tuck       OB History   None      Home Medications    Prior to Admission medications   Medication Sig Start Date End Date Taking? Authorizing Provider  aspirin 81 MG chewable tablet Chew 162 mg by mouth once as needed (for sudden onset of chest pain).     [provider]  cetirizine (ZYRTEC) 10 MG tablet Take 10 mg by mouth daily.    [provider]  CHROMIUM PO Take 1 capsule by mouth daily.    [provider]  clonazePAM (KLONOPIN) 0.5 MG tablet Take 0.25-0.5 mg by mouth daily as needed for anxiety.  06/08/15   [provider]  diltiazem (CARDIZEM CD) 180 MG 24 hr capsule Take 180 mg by mouth daily. 09/14/17   [provider]  omeprazole (PRILOSEC) 20 MG capsule Take 1 capsule (20 mg total) by mouth daily. 03/02/18 04/01/18  Margit Banda, MD    Family History Family History  Problem Relation Age of Onset  . Anxiety disorder Mother   . Hypertension Mother   . Hypertension Father   . Diabetes Father     Social History Social History   Tobacco Use  . Smoking status: Never Smoker  . Smokeless tobacco: Never Used  Substance Use Topics  . Alcohol use: No  . Drug use: No     Allergies   Beta adrenergic blockers; Caffeine; Ibuprofen; Other; Adhesive [tape]; Latex; and Macrobid [nitrofurantoin]   Review of Systems Review of Systems  Constitutional: Negative for chills and fever.  HENT: Negative for ear pain and sore throat.   Eyes: Negative for pain and visual disturbance.  Respiratory: Negative for cough and shortness of breath.   Cardiovascular: Negative for chest pain and palpitations.  Gastrointestinal: Positive for abdominal  distention, abdominal pain and nausea. Negative for blood in stool, constipation, diarrhea and vomiting.  Genitourinary: Negative for dysuria, flank pain, frequency and hematuria.  Musculoskeletal: Negative for arthralgias and back pain.  Skin: Negative for color change and rash.  Neurological: Negative for seizures and syncope.  All other systems reviewed and are negative.    Physical Exam Updated Vital Signs BP (!) 143/85   Pulse 70   Temp 98.9 F (37.2 C) (Oral)   Resp 16   SpO2 97%   Physical Exam  Constitutional: She appears well-developed and  well-nourished. No distress.  HENT:  Head: Normocephalic and atraumatic.  Eyes: Conjunctivae are normal.  Neck: Neck supple.  Cardiovascular: Normal rate and regular rhythm.  No murmur heard. Pulmonary/Chest: Effort normal and breath sounds normal. No respiratory distress.  Abdominal: Soft. There is generalized tenderness. There is no rigidity, no rebound, no guarding, no CVA tenderness, no tenderness at McBurney's point and negative Murphy's sign.  Musculoskeletal: She exhibits no edema.  Neurological: She is alert.  Skin: Skin is warm and dry.  Psychiatric: She has a normal mood and affect.  Nursing note and vitals reviewed.    ED Treatments / Results  Labs (all labs ordered are listed, but only abnormal results are displayed) Labs Reviewed  COMPREHENSIVE METABOLIC PANEL - Abnormal; Notable for the following components:      Result Value   Potassium 3.3 (*)    All other components within normal limits  URINALYSIS, ROUTINE W REFLEX MICROSCOPIC - Abnormal; Notable for the following components:   Color, Urine COLORLESS (*)    Specific Gravity, Urine 1.004 (*)    Hgb urine dipstick SMALL (*)    All other components within normal limits  LIPASE, BLOOD  CBC    EKG None  Radiology US Abdomen Limited Ruq  Result Date: 03/02/2018 CLINICAL DATA:  Right upper quadrant pain for a while, getting worse. Nausea. EXAM: ULTRASOUND ABDOMEN LIMITED RIGHT UPPER QUADRANT COMPARISON:  CT abdomen and pelvis 07/28/2016 FINDINGS: Gallbladder: Small stones and layering sludge in the gallbladder. Largest stone measures about 6 mm diameter. No wall thickening or edema. Murphy's sign is negative. Common bile duct: Diameter: 6.6 mm, upper limits of normal. Liver: Diffusely increased hepatic parenchymal echotexture likely representing fatty infiltration. No focal lesions identified. Portal vein is patent on color Doppler imaging with normal direction of blood flow towards the liver. IMPRESSION:  Cholelithiasis without additional changes to suggest cholecystitis. Diffuse fatty infiltration of the liver. Electronically Signed   By: Burman Nieves M.D.   On: 03/02/2018 21:13    Procedures Procedures (including critical care time)  Medications Ordered in ED Medications  gi cocktail (Maalox,Lidocaine,Donnatal) (30 mLs Oral Given 03/02/18 1921)     Initial Impression / Assessment and Plan / ED Course  I have reviewed the triage vital signs and the nursing notes.  Pertinent labs & imaging results that were available during my care of the patient were reviewed by me and considered in my medical decision making (see chart for details).     57 year old female premeasuring for HTN, hyper cholesterolemia presents with chief complaint of abdominal pain.  History as above.  Patient given GI cocktail with marked improvement in symptoms.  Lab imaging performed.  Large within normal limits without signs of obstruction pattern.  Right bladder ultrasound performed reveals cholelithiasis without signs of cholecystitis.  Patient was symptomatically lithiasis without cholecystitis.  Patient given outpatient follow-up with general surgery for evaluation for elective cholecystectomy.  Final Clinical Impressions(s) /  ED Diagnoses   Final diagnoses:  Generalized abdominal pain  Cholelithiases  Calculus of gallbladder without cholecystitis without obstruction    ED Discharge Orders         Ordered    omeprazole (PRILOSEC) 20 MG capsule  Daily     03/02/18 1837           Margit Banda, MD 03/02/18 2125    Cathren Laine, MD 03/03/18 1229

## 2018-03-02 NOTE — ED Notes (Signed)
ED Provider at bedside. 

## 2018-03-02 NOTE — ED Notes (Signed)
Patient transported to Ultrasound 

## 2018-03-02 NOTE — ED Triage Notes (Signed)
Pt endorses RUQ pain x 2 months, worse after eating. Denies n/v/d, chills or fevers. Hypertensive in triage and has hx of same.

## 2018-03-03 LAB — URINE CULTURE: Culture: NO GROWTH

## 2018-03-05 ENCOUNTER — Ambulatory Visit
Admission: RE | Admit: 2018-03-05 | Discharge: 2018-03-05 | Disposition: A | Discharge status: Home / Self Care | Payer: 59 | Source: Ambulatory Visit | Attending: Surgery | Admitting: Surgery

## 2018-03-05 ENCOUNTER — Other Ambulatory Visit: Payer: Self-pay | Admitting: Surgery

## 2018-03-05 DIAGNOSIS — R109 Unspecified abdominal pain: Secondary | ICD-10-CM

## 2018-03-05 MED ORDER — IOPAMIDOL (ISOVUE-300) INJECTION 61%
100.0000 mL | Freq: Once | INTRAVENOUS | Status: AC | PRN
  Administered 2018-03-05: 100 mL via INTRAVENOUS

## 2018-03-06 ENCOUNTER — Encounter (HOSPITAL_COMMUNITY): Payer: Self-pay | Admitting: *Deleted

## 2018-03-06 ENCOUNTER — Ambulatory Visit: Payer: Self-pay | Admitting: Surgery

## 2018-03-06 ENCOUNTER — Other Ambulatory Visit: Payer: Self-pay

## 2018-03-06 NOTE — H&P (View-Only) (Signed)
Theresa Bradford Documented: 03/05/2018 10:47 AM Location: [facility] Patient #: [id number] DOB: 06/27/1960 Single / Language: English / Race: Black or African American Female   History of Present Illness ([name] [name] MD; 03/05/2018 11:04 AM) Patient words: 57yo woman known to me following emergent laparoscopic appendectomy in March 2018 is referred by the ER for symptomatic cholelithiasis. She presented there 3 days ago with complaints of worsening postprandial generalized abdominal pain and bloating for the preceding 2 months. She reports that anytime she eats her entire abdomen becomes very bloated, she's having diffuse abdominal pain and really does not localize either side or more superior or inferior. She denies fevers or vomiting, did start to have nausea for the last couple of days. She reports that her bowel movements are normal, under 3 daily without any diarrhea, no melena or hematochezia. She has tried changing her diet and she is mostly sticking to protein shakes which is really minimally improved her symptoms. She has not been eating much lately as she is afraid due to the symptoms it brings on. Apparently GI cocktail given in the ER did markedly improve the symptoms. Ultrasound demonstrated gallstones without cholecystitis, common bile duct 6.6 mm, likely hepatic steatosis. CMP and CBC were normal except for mild hypokalemia.  She reports that she had an upper endoscopy and colonoscopy in December and that she had one colon polyp but otherwise unremarkable.  She is a CPA. She is here today with her friend who is an anesthesiologist from [place], actually in the process of starting at [employer] Has a history of frequent UTIs, hypertension, prediabetes, C-section, tummy tuck, breast reduction, lap appendectomy.  The patient is a 57 year old female.   Medication History (Armen Ferguson, CMA; 03/05/2018 10:49 AM) ClonazePAM (0.5MG Tablet, Oral) Active. DilTIAZem  HCl ER Coated Beads (120MG Capsule ER 24HR, Oral) Active. ZyrTEC (10MG Tablet, Oral) Active. Medications Reconciled    Review of Systems (Eliya Geiman A. Daymian Lill MD; 03/05/2018 11:04 AM) All other systems negative  Vitals (Armen Ferguson CMA; 03/05/2018 10:48 AM) 03/05/2018 10:48 AM Weight: 173.25 lb Height: 64in Body Surface Area: 1.84 m Body Mass Index: 29.74 kg/m  Temp.: 98.2F  Pulse: 101 (Regular)  P.OX: 100% (Room air) BP: 138/82 (Sitting, Left Arm, Standard)       Physical Exam (Akire Rennert A. Bobbe Quilter MD; 03/05/2018 11:05 AM) The physical exam findings are as follows: Note:Gen: alert and well appearing Eye: extraocular motion intact, no scleral icterus ENT: moist mucus membranes, dentition intact Neck: no mass or thyromegaly Chest: unlabored respirations, symmetrical air entry, clear bilaterally CV: regular rate and rhythm, no pedal edema Abdomen: soft, mildly distended. Diffusely tender without rebound or guarding, perhaps slightly more tender in the right upper quadrant but really tender throughout. No mass or organomegaly. Multiple well-healed incisions. MSK: strength symmetrical throughout, no deformity Neuro: grossly intact, normal gait Psych: normal mood and affect, appropriate insight Skin: warm and dry, no rash or lesion on limited exam    Assessment & Plan (Gerard Bonus A. Tabytha Gradillas MD; 03/05/2018 11:07 AM) ABDOMINAL PAIN (R10.9) Story: While she may be having biliary colic, she seems to be having essentially constant and diffuse abdominal pain which would not be typical for that. Fortunately she has had upper and lower endoscopy in the last year without concerning findings, but given the atypical nature of her pain and bloating will order a CT scan to evaluate for any other cause for her symptoms prior to proceeding with cholecystectomy. I discussed with her laparoscopic cholecystectomy with   possible cholangiogram. Discussed risks of surgery including bleeding,  pain, scarring, intraabdominal injury specifically to the common bile duct and sequelae, conversion to open surgery, failure to resolve symptoms, blood clots/ pulmonary embolus, heart attack, pneumonia, stroke, death. Questions welcomed and answered to patient's satisfaction. If CT scan is reassuring will plan for laparoscopic cholecystectomy next week.   CT shows large stool burden and possible gastritis.  Patient advised to start taking MiraLAX and will prescribe PPI.  We'll proceed with laparoscopic cholecystectomy as well.  

## 2018-03-06 NOTE — Progress Notes (Signed)
Spoke with pt for pre-op call. Pt denies cardiac history. She states she had an Echo done about 3 years ago for some palpitations. Have requested copy of Echo from [facility] Pt states she is Pre-diabetic. Last A1C was 5.9 two months ago per pt. She states she does not check her blood sugar at home.

## 2018-03-06 NOTE — H&P (Signed)
Theresa Bradford Documented: 03/05/2018 10:47 AM Location: [facility] Patient #: [id number] DOB: 1960/06/16 Single / Language: [place] / Race: Black or African American Female   History of Present Illness (Ellene Bloodsaw A. Fredricka Bonine MD; 03/05/2018 11:04 AM) Patient words: 56yo woman known to me following emergent laparoscopic appendectomy in March 2018 is referred by the ER for symptomatic cholelithiasis. She presented there 3 days ago with complaints of worsening postprandial generalized abdominal pain and bloating for the preceding 2 months. She reports that anytime she eats her entire abdomen becomes very bloated, she's having diffuse abdominal pain and really does not localize either side or more superior or inferior. She denies fevers or vomiting, did start to have nausea for the last couple of days. She reports that her bowel movements are normal, under 3 daily without any diarrhea, no melena or hematochezia. She has tried changing her diet and she is mostly sticking to protein shakes which is really minimally improved her symptoms. She has not been eating much lately as she is afraid due to the symptoms it brings on. Apparently GI cocktail given in the ER did markedly improve the symptoms. Ultrasound demonstrated gallstones without cholecystitis, common bile duct 6.6 mm, likely hepatic steatosis. CMP and CBC were normal except for mild hypokalemia.  She reports that she had an upper endoscopy and colonoscopy in December and that she had one colon polyp but otherwise unremarkable.  She is a IT trainer. She is here today with her friend who is an anesthesiologist from [place], actually in the process of starting at [employer] [facility] [employer] [facility] [employer] Has a history of frequent UTIs, hypertension, prediabetes, C-section, tummy tuck, breast reduction, lap appendectomy.  The patient is a 57 year old female.   Medication History (Armen Ferguson, CMA; 03/05/2018 10:49 AM) ClonazePAM (0.5MG  Tablet, Oral) Active. DilTIAZem  HCl ER Coated Beads (120MG  Capsule ER 24HR, Oral) Active. ZyrTEC (10MG  Tablet, Oral) Active. Medications Reconciled    Review of Systems (Meika Earll A. Fredricka Bonine MD; 03/05/2018 11:04 AM) All other systems negative  Vitals (Armen Ferguson CMA; 03/05/2018 10:48 AM) 03/05/2018 10:48 AM Weight: 173.25 lb Height: 64in Body Surface Area: 1.84 m Body Mass Index: 29.74 kg/m  Temp.: 98.66F  Pulse: 101 (Regular)  P.OX: 100% (Room air) BP: 138/82 (Sitting, Left Arm, Standard)       Physical Exam (Telicia Hodgkiss A. Fredricka Bonine MD; 03/05/2018 11:05 AM) The physical exam findings are as follows: Note:Gen: alert and well appearing Eye: extraocular motion intact, no scleral icterus ENT: moist mucus membranes, dentition intact Neck: no mass or thyromegaly Chest: unlabored respirations, symmetrical air entry, clear bilaterally CV: regular rate and rhythm, no pedal edema Abdomen: soft, mildly distended. Diffusely tender without rebound or guarding, perhaps slightly more tender in the right upper quadrant but really tender throughout. No mass or organomegaly. Multiple well-healed incisions. MSK: strength symmetrical throughout, no deformity Neuro: grossly intact, normal gait Psych: normal mood and affect, appropriate insight Skin: warm and dry, no rash or lesion on limited exam    Assessment & Plan (Ryan Ogborn A. Yeng Frankie MD; 03/05/2018 11:07 AM) ABDOMINAL PAIN (R10.9) Story: While she may be having biliary colic, she seems to be having essentially constant and diffuse abdominal pain which would not be typical for that. Fortunately she has had upper and lower endoscopy in the last year without concerning findings, but given the atypical nature of her pain and bloating will order a CT scan to evaluate for any other cause for her symptoms prior to proceeding with cholecystectomy. I discussed with her laparoscopic cholecystectomy with  possible cholangiogram. Discussed risks of surgery including bleeding,  pain, scarring, intraabdominal injury specifically to the common bile duct and sequelae, conversion to open surgery, failure to resolve symptoms, blood clots/ pulmonary embolus, heart attack, pneumonia, stroke, death. Questions welcomed and answered to patient's satisfaction. If CT scan is reassuring will plan for laparoscopic cholecystectomy next week.   CT shows large stool burden and possible gastritis.  Patient advised to start taking MiraLAX and will prescribe PPI.  We'll proceed with laparoscopic cholecystectomy as well.

## 2018-03-09 ENCOUNTER — Ambulatory Visit (HOSPITAL_COMMUNITY): Payer: 59 | Admitting: Certified Registered"

## 2018-03-09 ENCOUNTER — Other Ambulatory Visit: Payer: Self-pay

## 2018-03-09 ENCOUNTER — Encounter (HOSPITAL_COMMUNITY): Payer: Self-pay | Admitting: *Deleted

## 2018-03-09 ENCOUNTER — Ambulatory Visit (HOSPITAL_COMMUNITY)
Admission: RE | Admit: 2018-03-09 | Discharge: 2018-03-09 | Disposition: A | Discharge status: Home / Self Care | Payer: 59 | Source: Ambulatory Visit | Attending: Surgery | Admitting: Surgery

## 2018-03-09 ENCOUNTER — Encounter (HOSPITAL_COMMUNITY)
Admission: RE | Disposition: A | Discharge status: Home / Self Care | Payer: Self-pay | Source: Ambulatory Visit | Attending: Surgery

## 2018-03-09 DIAGNOSIS — Z91040 Latex allergy status: Secondary | ICD-10-CM | POA: Diagnosis not present

## 2018-03-09 DIAGNOSIS — E876 Hypokalemia: Secondary | ICD-10-CM | POA: Diagnosis not present

## 2018-03-09 DIAGNOSIS — Z79899 Other long term (current) drug therapy: Secondary | ICD-10-CM | POA: Diagnosis not present

## 2018-03-09 DIAGNOSIS — Z886 Allergy status to analgesic agent status: Secondary | ICD-10-CM | POA: Diagnosis not present

## 2018-03-09 DIAGNOSIS — K8010 Calculus of gallbladder with chronic cholecystitis without obstruction: Secondary | ICD-10-CM | POA: Diagnosis not present

## 2018-03-09 DIAGNOSIS — I10 Essential (primary) hypertension: Secondary | ICD-10-CM | POA: Diagnosis not present

## 2018-03-09 DIAGNOSIS — Z888 Allergy status to other drugs, medicaments and biological substances status: Secondary | ICD-10-CM | POA: Diagnosis not present

## 2018-03-09 DIAGNOSIS — K8050 Calculus of bile duct without cholangitis or cholecystitis without obstruction: Secondary | ICD-10-CM | POA: Diagnosis present

## 2018-03-09 HISTORY — DX: Unspecified osteoarthritis, unspecified site: M19.90

## 2018-03-09 HISTORY — PX: CHOLECYSTECTOMY: SHX55

## 2018-03-09 HISTORY — DX: Nausea with vomiting, unspecified: R11.2

## 2018-03-09 HISTORY — DX: Other specified postprocedural states: Z98.890

## 2018-03-09 HISTORY — DX: Prediabetes: R73.03

## 2018-03-09 LAB — GLUCOSE, CAPILLARY: GLUCOSE-CAPILLARY: 99 mg/dL (ref 70–99)

## 2018-03-09 SURGERY — LAPAROSCOPIC CHOLECYSTECTOMY
Anesthesia: General | Site: Abdomen

## 2018-03-09 MED ORDER — ACETAMINOPHEN 325 MG PO TABS: 650.0000 mg | ORAL_TABLET | ORAL | Status: DC | PRN

## 2018-03-09 MED ORDER — ONDANSETRON HCL 4 MG/2ML IJ SOLN
INTRAMUSCULAR | Status: DC | PRN
  Administered 2018-03-09: 4 mg via INTRAVENOUS

## 2018-03-09 MED ORDER — HYDROMORPHONE HCL 1 MG/ML IJ SOLN
INTRAMUSCULAR | Status: AC
  Administered 2018-03-09: 0.5 mg via INTRAVENOUS
  Filled 2018-03-09: qty 1

## 2018-03-09 MED ORDER — DEXAMETHASONE SODIUM PHOSPHATE 10 MG/ML IJ SOLN
INTRAMUSCULAR | Status: AC
  Filled 2018-03-09: qty 1

## 2018-03-09 MED ORDER — SODIUM CHLORIDE 0.9% FLUSH: 3.0000 mL | Freq: Two times a day (BID) | INTRAVENOUS | Status: DC

## 2018-03-09 MED ORDER — CEFAZOLIN SODIUM-DEXTROSE 2-4 GM/100ML-% IV SOLN
INTRAVENOUS | Status: AC
  Filled 2018-03-09: qty 100

## 2018-03-09 MED ORDER — ACETAMINOPHEN 650 MG RE SUPP: 650.0000 mg | RECTAL | Status: DC | PRN

## 2018-03-09 MED ORDER — ROCURONIUM BROMIDE 50 MG/5ML IV SOSY
PREFILLED_SYRINGE | INTRAVENOUS | Status: AC
  Filled 2018-03-09: qty 5

## 2018-03-09 MED ORDER — PHENYLEPHRINE 40 MCG/ML (10ML) SYRINGE FOR IV PUSH (FOR BLOOD PRESSURE SUPPORT)
PREFILLED_SYRINGE | INTRAVENOUS | Status: AC
  Filled 2018-03-09: qty 10

## 2018-03-09 MED ORDER — HYDROMORPHONE HCL 1 MG/ML IJ SOLN
0.2500 mg | INTRAMUSCULAR | Status: DC | PRN
  Administered 2018-03-09 (×3): 0.5 mg via INTRAVENOUS

## 2018-03-09 MED ORDER — DEXAMETHASONE SODIUM PHOSPHATE 10 MG/ML IJ SOLN
INTRAMUSCULAR | Status: DC | PRN
  Administered 2018-03-09: 10 mg via INTRAVENOUS

## 2018-03-09 MED ORDER — SUCCINYLCHOLINE CHLORIDE 20 MG/ML IJ SOLN
INTRAMUSCULAR | Status: DC | PRN
  Administered 2018-03-09: 120 mg via INTRAVENOUS

## 2018-03-09 MED ORDER — FENTANYL CITRATE (PF) 250 MCG/5ML IJ SOLN
INTRAMUSCULAR | Status: AC
  Filled 2018-03-09: qty 5

## 2018-03-09 MED ORDER — MIDAZOLAM HCL 2 MG/2ML IJ SOLN
INTRAMUSCULAR | Status: AC
  Filled 2018-03-09: qty 2

## 2018-03-09 MED ORDER — SODIUM CHLORIDE 0.9 % IV SOLN: 250.0000 mL | INTRAVENOUS | Status: DC | PRN

## 2018-03-09 MED ORDER — 0.9 % SODIUM CHLORIDE (POUR BTL) OPTIME
TOPICAL | Status: DC | PRN
  Administered 2018-03-09: 1000 mL

## 2018-03-09 MED ORDER — ACETAMINOPHEN 500 MG PO TABS
ORAL_TABLET | ORAL | Status: AC
  Administered 2018-03-09: 1000 mg via ORAL
  Filled 2018-03-09: qty 2

## 2018-03-09 MED ORDER — ESOMEPRAZOLE MAGNESIUM 40 MG PO CPDR
40.0000 mg | DELAYED_RELEASE_CAPSULE | Freq: Two times a day (BID) | ORAL | 3 refills | Status: AC
Start: 2018-03-09 — End: 2019-03-09

## 2018-03-09 MED ORDER — CHLORHEXIDINE GLUCONATE 4 % EX LIQD: 60.0000 mL | Freq: Once | CUTANEOUS | Status: DC

## 2018-03-09 MED ORDER — PHENYLEPHRINE 40 MCG/ML (10ML) SYRINGE FOR IV PUSH (FOR BLOOD PRESSURE SUPPORT)
PREFILLED_SYRINGE | INTRAVENOUS | Status: DC | PRN
  Administered 2018-03-09: 120 ug via INTRAVENOUS
  Administered 2018-03-09 (×2): 80 ug via INTRAVENOUS
  Administered 2018-03-09: 120 ug via INTRAVENOUS
  Administered 2018-03-09: 80 ug via INTRAVENOUS
  Administered 2018-03-09: 120 ug via INTRAVENOUS

## 2018-03-09 MED ORDER — CEFAZOLIN SODIUM-DEXTROSE 2-4 GM/100ML-% IV SOLN
2.0000 g | INTRAVENOUS | Status: AC
  Administered 2018-03-09: 2 g via INTRAVENOUS

## 2018-03-09 MED ORDER — POLYETHYLENE GLYCOL 3350 17 G PO PACK: 17.0000 g | PACK | Freq: Two times a day (BID) | ORAL | 0 refills | Status: DC

## 2018-03-09 MED ORDER — SODIUM CHLORIDE 0.9% FLUSH: 3.0000 mL | INTRAVENOUS | Status: DC | PRN

## 2018-03-09 MED ORDER — ONDANSETRON HCL 4 MG/2ML IJ SOLN
INTRAMUSCULAR | Status: AC
  Filled 2018-03-09: qty 2

## 2018-03-09 MED ORDER — ACETAMINOPHEN 500 MG PO TABS
1000.0000 mg | ORAL_TABLET | ORAL | Status: AC
  Administered 2018-03-09: 1000 mg via ORAL

## 2018-03-09 MED ORDER — OXYCODONE HCL 5 MG PO TABS
5.0000 mg | ORAL_TABLET | ORAL | Status: DC | PRN
  Administered 2018-03-09: 5 mg via ORAL

## 2018-03-09 MED ORDER — FENTANYL CITRATE (PF) 100 MCG/2ML IJ SOLN: 25.0000 ug | INTRAMUSCULAR | Status: DC | PRN

## 2018-03-09 MED ORDER — STERILE WATER FOR IRRIGATION IR SOLN
Status: DC | PRN
  Administered 2018-03-09: 1000 mL

## 2018-03-09 MED ORDER — LIDOCAINE 2% (20 MG/ML) 5 ML SYRINGE
INTRAMUSCULAR | Status: AC
  Filled 2018-03-09: qty 5

## 2018-03-09 MED ORDER — PROPOFOL 10 MG/ML IV BOLUS
INTRAVENOUS | Status: DC | PRN
  Administered 2018-03-09: 170 mg via INTRAVENOUS

## 2018-03-09 MED ORDER — HYDROCODONE-ACETAMINOPHEN 5-325 MG PO TABS
1.0000 | ORAL_TABLET | Freq: Four times a day (QID) | ORAL | 0 refills | Status: AC | PRN
Start: 2018-03-09 — End: ?

## 2018-03-09 MED ORDER — SUCCINYLCHOLINE CHLORIDE 200 MG/10ML IV SOSY
PREFILLED_SYRINGE | INTRAVENOUS | Status: AC
  Filled 2018-03-09: qty 10

## 2018-03-09 MED ORDER — SUGAMMADEX SODIUM 200 MG/2ML IV SOLN
INTRAVENOUS | Status: DC | PRN
  Administered 2018-03-09: 150 mg via INTRAVENOUS

## 2018-03-09 MED ORDER — GABAPENTIN 300 MG PO CAPS
300.0000 mg | ORAL_CAPSULE | ORAL | Status: AC
  Administered 2018-03-09: 300 mg via ORAL

## 2018-03-09 MED ORDER — PROPOFOL 10 MG/ML IV BOLUS
INTRAVENOUS | Status: AC
  Filled 2018-03-09: qty 20

## 2018-03-09 MED ORDER — LIDOCAINE 2% (20 MG/ML) 5 ML SYRINGE
INTRAMUSCULAR | Status: DC | PRN
  Administered 2018-03-09: 100 mg via INTRAVENOUS

## 2018-03-09 MED ORDER — FENTANYL CITRATE (PF) 100 MCG/2ML IJ SOLN
INTRAMUSCULAR | Status: DC | PRN
  Administered 2018-03-09: 100 ug via INTRAVENOUS

## 2018-03-09 MED ORDER — OXYCODONE HCL 5 MG PO TABS
ORAL_TABLET | ORAL | Status: AC
  Filled 2018-03-09: qty 1

## 2018-03-09 MED ORDER — ROCURONIUM BROMIDE 50 MG/5ML IV SOSY
PREFILLED_SYRINGE | INTRAVENOUS | Status: DC | PRN
  Administered 2018-03-09: 30 mg via INTRAVENOUS
  Administered 2018-03-09: 10 mg via INTRAVENOUS

## 2018-03-09 MED ORDER — MIDAZOLAM HCL 2 MG/2ML IJ SOLN
INTRAMUSCULAR | Status: DC | PRN
  Administered 2018-03-09: 2 mg via INTRAVENOUS

## 2018-03-09 MED ORDER — LACTATED RINGERS IV SOLN
INTRAVENOUS | Status: DC
  Administered 2018-03-09: 08:00:00 via INTRAVENOUS

## 2018-03-09 MED ORDER — SODIUM CHLORIDE 0.9 % IR SOLN
Status: DC | PRN
  Administered 2018-03-09: 1000 mL

## 2018-03-09 MED ORDER — BUPIVACAINE-EPINEPHRINE (PF) 0.25% -1:200000 IJ SOLN
INTRAMUSCULAR | Status: AC
  Filled 2018-03-09: qty 30

## 2018-03-09 MED ORDER — BUPIVACAINE LIPOSOME 1.3 % IJ SUSP
20.0000 mL | INTRAMUSCULAR | Status: AC
  Administered 2018-03-09: 20 mL
  Filled 2018-03-09: qty 20

## 2018-03-09 MED ORDER — GABAPENTIN 300 MG PO CAPS
ORAL_CAPSULE | ORAL | Status: AC
  Administered 2018-03-09: 300 mg via ORAL
  Filled 2018-03-09: qty 1

## 2018-03-09 SURGICAL SUPPLY — 37 items
APPLIER CLIP 5 13 M/L LIGAMAX5 (MISCELLANEOUS) ×3
CANISTER SUCT 3000ML PPV (MISCELLANEOUS) ×3 IMPLANT
CHLORAPREP W/TINT 26ML (MISCELLANEOUS) ×3 IMPLANT
CLIP APPLIE 5 13 M/L LIGAMAX5 (MISCELLANEOUS) ×1 IMPLANT
COVER SURGICAL LIGHT HANDLE (MISCELLANEOUS) ×3 IMPLANT
DERMABOND ADVANCED (GAUZE/BANDAGES/DRESSINGS) ×2
DERMABOND ADVANCED .7 DNX12 (GAUZE/BANDAGES/DRESSINGS) ×1 IMPLANT
ELECT REM PT RETURN 9FT ADLT (ELECTROSURGICAL) ×3
ELECTRODE REM PT RTRN 9FT ADLT (ELECTROSURGICAL) ×1 IMPLANT
GLOVE BIO SURGEON STRL SZ 6 (GLOVE) ×3 IMPLANT
GLOVE BIOGEL PI IND STRL 6.5 (GLOVE) ×3 IMPLANT
GLOVE BIOGEL PI IND STRL 8 (GLOVE) ×1 IMPLANT
GLOVE BIOGEL PI INDICATOR 6.5 (GLOVE) ×6
GLOVE BIOGEL PI INDICATOR 8 (GLOVE) ×2
GLOVE INDICATOR 6.5 STRL GRN (GLOVE) ×3 IMPLANT
GLOVE SURG SS PI 6.0 STRL IVOR (GLOVE) ×6 IMPLANT
GOWN STRL REUS W/ TWL LRG LVL3 (GOWN DISPOSABLE) ×3 IMPLANT
GOWN STRL REUS W/TWL LRG LVL3 (GOWN DISPOSABLE) ×6
GRASPER SUT TROCAR 14GX15 (MISCELLANEOUS) ×3 IMPLANT
KIT BASIN OR (CUSTOM PROCEDURE TRAY) ×3 IMPLANT
KIT TURNOVER KIT B (KITS) ×3 IMPLANT
NEEDLE INSUFFLATION 14GA 120MM (NEEDLE) ×3 IMPLANT
NS IRRIG 1000ML POUR BTL (IV SOLUTION) ×3 IMPLANT
PAD ARMBOARD 7.5X6 YLW CONV (MISCELLANEOUS) ×3 IMPLANT
POUCH RETRIEVAL ECOSAC 10 (ENDOMECHANICALS) ×1 IMPLANT
POUCH RETRIEVAL ECOSAC 10MM (ENDOMECHANICALS) ×2
SCISSORS LAP 5X35 DISP (ENDOMECHANICALS) ×3 IMPLANT
SET IRRIG TUBING LAPAROSCOPIC (IRRIGATION / IRRIGATOR) ×3 IMPLANT
SLEEVE ENDOPATH XCEL 5M (ENDOMECHANICALS) ×6 IMPLANT
SPECIMEN JAR SMALL (MISCELLANEOUS) ×3 IMPLANT
SUT MNCRL AB 4-0 PS2 18 (SUTURE) ×3 IMPLANT
TOWEL GREEN STERILE (TOWEL DISPOSABLE) ×3 IMPLANT
TRAY LAPAROSCOPIC MC (CUSTOM PROCEDURE TRAY) ×3 IMPLANT
TROCAR XCEL NON-BLD 11X100MML (ENDOMECHANICALS) ×3 IMPLANT
TROCAR XCEL NON-BLD 5MMX100MML (ENDOMECHANICALS) ×3 IMPLANT
TUBING INSUFFLATION (TUBING) ×3 IMPLANT
WATER STERILE IRR 1000ML POUR (IV SOLUTION) ×3 IMPLANT

## 2018-03-09 NOTE — Transfer of Care (Signed)
Immediate Anesthesia Transfer of Care Note  Patient: Theresa Bradford  Procedure(s) Performed: LAPAROSCOPIC CHOLECYSTECTOMY (N/A Abdomen)  Patient Location: PACU  Anesthesia Type:General  Level of Consciousness: drowsy and patient cooperative  Airway & Oxygen Therapy: Patient Spontanous Breathing and Patient connected to nasal cannula oxygen  Post-op Assessment: Report given to RN  Post vital signs: Reviewed and stable  Last Vitals:  Vitals Value Taken Time  BP 144/85 03/09/2018 10:15 AM  Temp    Pulse 83 03/09/2018 10:17 AM  Resp 19 03/09/2018 10:17 AM  SpO2 100 % 03/09/2018 10:17 AM  Vitals shown include unvalidated device data.  Last Pain:  Vitals:   03/09/18 0723  TempSrc:   PainSc: 0-No pain      Patients Stated Pain Goal: 0 (03/09/18 0723)  Complications: No apparent anesthesia complications

## 2018-03-09 NOTE — Discharge Instructions (Signed)
LAPAROSCOPIC SURGERY: POST OP INSTRUCTIONS ° °###################################################################### ° °EAT °Gradually transition to a high fiber diet with a fiber supplement over the next few weeks after discharge.  Start with a pureed / full liquid diet (see below) ° °WALK °Walk an hour a day.  Control your pain to do that.   ° °CONTROL PAIN °Control pain so that you can walk, sleep, tolerate sneezing/coughing, go up/down stairs. ° °HAVE A BOWEL MOVEMENT DAILY °Keep your bowels regular to avoid problems.  OK to try a laxative to override constipation.  OK to use an antidairrheal to slow down diarrhea.  Call if not better after 2 tries ° °CALL IF YOU HAVE PROBLEMS/CONCERNS °Call if you are still struggling despite following these instructions. °Call if you have concerns not answered by these instructions ° °###################################################################### ° ° ° °1. DIET: Follow a light bland diet the first 24 hours after arrival home, such as soup, liquids, crackers, etc.  Be sure to include lots of fluids daily.  Avoid fast food or heavy meals as your are more likely to get nauseated.  Eat a low fat the next few days after surgery.   °2. Take your usually prescribed home medications unless otherwise directed. °3. PAIN CONTROL: °a. Pain is best controlled by a usual combination of three different methods TOGETHER: °i. Ice/Heat °ii. Over the counter pain medication °iii. Prescription pain medication °b. Most patients will experience some swelling and bruising around the incisions.  Ice packs or heating pads (30-60 minutes up to 6 times a day) will help. Use ice for the first few days to help decrease swelling and bruising, then switch to heat to help relax tight/sore spots and speed recovery.  Some people prefer to use ice alone, heat alone, alternating between ice & heat.  Experiment to what works for you.  Swelling and bruising can take several weeks to resolve.   °c. It is  helpful to take an over-the-counter pain medication regularly for the first few weeks.  Choose one of the following that works best for you: °i. Naproxen (Aleve, etc)  Two 220mg tabs twice a day °ii. Ibuprofen (Advil, etc) Three 200mg tabs four times a day (every meal & bedtime) °iii. Acetaminophen (Tylenol, etc) 500-650mg four times a day (every meal & bedtime) °d. A  prescription for pain medication (such as oxycodone, hydrocodone, etc) should be given to you upon discharge.  Take your pain medication as prescribed.  °i. If you are having problems/concerns with the prescription medicine (does not control pain, nausea, vomiting, rash, itching, etc), please call us [phone] to see if we need to switch you to a different pain medicine that will work better for you and/or control your side effect better. °ii. If you need a refill on your pain medication, please contact your pharmacy.  They will contact our office to request authorization. Prescriptions will not be filled after 5 pm or on week-ends. °4. Avoid getting constipated.  Between the surgery and the pain medications, it is common to experience some constipation.  Increasing fluid intake and taking a fiber supplement (such as Metamucil, Citrucel, FiberCon, MiraLax, etc) 1-2 times a day regularly will usually help prevent this problem from occurring.  A mild laxative (prune juice, Milk of Magnesia, MiraLax, etc) should be taken according to package directions if there are no bowel movements after 48 hours.   °5. Watch out for diarrhea.  If you have many loose bowel movements, simplify your diet to bland foods & liquids for   a few days.  Stop any stool softeners and decrease your fiber supplement.  Switching to mild anti-diarrheal medications (Kayopectate, Pepto Bismol) can help.  If this worsens or does not improve, please call us. °6. Wash / shower every day.  You may shower over the skin glue which is waterproof.  Continue to shower over incision(s) after  the dressing is off. °7. Skin glue will flake off after 2 weeks.  You may leave the incision open to air.  You may replace a dressing/Band-Aid to cover the incision for comfort if you wish.  °8. ACTIVITIES as tolerated:   °a. You may resume regular (light) daily activities beginning the next day--such as daily self-care, walking, climbing stairs--gradually increasing activities as tolerated.  If you can walk 30 minutes without difficulty, it is safe to try more intense activity such as jogging, treadmill, bicycling, low-impact aerobics, swimming, etc. °b. Save the most intensive and strenuous activity for last such as sit-ups, heavy lifting, contact sports, etc  Refrain from any heavy lifting or straining until you are off narcotics for pain control.   °c. DO NOT PUSH THROUGH PAIN.  Let pain be your guide: If it hurts to do something, don't do it.  Pain is your body warning you to avoid that activity for another week until the pain goes down. °d. You may drive when you are no longer taking prescription pain medication, you can comfortably wear a seatbelt, and you can safely maneuver your car and apply brakes. °e. You may have sexual intercourse when it is comfortable.  °9. FOLLOW UP in our office °a. Please call [facility] at [phone] to set up an appointment to see your surgeon in the office for a follow-up appointment approximately 2-3 weeks after your surgery. °b. Make sure that you call for this appointment the day you arrive home to insure a convenient appointment time. °10. IF YOU HAVE DISABILITY OR FAMILY LEAVE FORMS, BRING THEM TO THE OFFICE FOR PROCESSING.  DO NOT GIVE THEM TO YOUR DOCTOR. ° ° °WHEN TO CALL US [phone]: °1. Poor pain control °2. Reactions / problems with new medications (rash/itching, nausea, etc)  °3. Fever over 101.5 F (38.5 C) °4. Inability to urinate °5. Nausea and/or vomiting °6. Worsening swelling or bruising °7. Continued bleeding from incision. °8. Increased pain, redness, or  drainage from the incision ° ° The clinic staff is available to answer your questions during regular business hours (8:30am-5pm).  Please don’t hesitate to call and ask to speak to one of our nurses for clinical concerns.  ° If you have a medical emergency, go to the nearest emergency room or call 911. ° A surgeon from [facility] is always on call at the hospitals ° ° °[facility] °[address], [address], [place], [place]  [address] ? °MAIN: [phone] ? TOLL FREE: [phone] ?  °FAX [phone] °[email or web address] ° ° °

## 2018-03-09 NOTE — Anesthesia Postprocedure Evaluation (Signed)
Anesthesia Post Note  Patient: Theresa Bradford  Procedure(s) Performed: LAPAROSCOPIC CHOLECYSTECTOMY (N/A Abdomen)     Patient location during evaluation: PACU Anesthesia Type: General Level of consciousness: awake Pain management: pain level controlled Vital Signs Assessment: post-procedure vital signs reviewed and stable Respiratory status: spontaneous breathing Cardiovascular status: stable Postop Assessment: no headache Anesthetic complications: no    Last Vitals:  Vitals:   03/09/18 1115 03/09/18 1209  BP: (!) 159/88 134/70  Pulse: (!) 57 60  Resp: 17 13  Temp:    SpO2: 97% 98%    Last Pain:  Vitals:   03/09/18 1209  TempSrc:   PainSc: 0-No pain                 Donovin Kraemer

## 2018-03-09 NOTE — Interval H&P Note (Signed)
History and Physical Interval Note:  03/09/2018 8:27 AM  Theresa Bradford  has presented today for surgery, with the diagnosis of Biliary colic  The various methods of treatment have been discussed with the patient and family. After consideration of risks, benefits and other options for treatment, the patient has consented to  Procedure(s): LAPAROSCOPIC CHOLECYSTECTOMY (N/A) as a surgical intervention .  The patient's history has been reviewed, patient examined, no change in status, stable for surgery.  I have reviewed the patient's chart and labs.  Questions were answered to the patient's satisfaction.     Zeek Rostron Lollie Sails

## 2018-03-09 NOTE — Op Note (Signed)
Operative Note  Theresa Bradford 57 y.o. female [id number]  03/09/2018  Surgeon: Berna Bue MD  Assistant: none  Procedure performed: Laparoscopic Cholecystectomy  Preop diagnosis: biliary colic Post-op diagnosis/intraop findings: same  Specimens: gallbladder  EBL: minimal  Complications: none  Description of procedure: After obtaining informed consent the patient was brought to the operating room. Prophylactic antibiotics and subcutaneous heparin were administered. SCD's were applied. General endotracheal anesthesia was initiated and a formal time-out was performed. The abdomen was prepped and draped in the usual sterile fashion and the abdomen was entered using an infraumbilical veress needle after instilling the site with local. Insufflation to was obtained, 5mm trocar and camera placed and gross inspection revealed no evidence of injury from our entry or other intraabdominal abnormalities. The anterior surface of the stomach appeared normal, the colon was mildly distended but no external abnormalities. No adhesions. Two 5mm trocars were introduced in the right midclavicular and right anterior axillary lines under direct visualization and following infiltration with exparel. An 11mm trocar was placed in the epigastrium. The gallbladder was retracted cephalad and the infundibulum was retracted laterally. A combination of hook electrocautery and blunt dissection was utilized to clear the peritoneum from the neck and cystic duct, circumferentially isolating the cystic artery and cystic duct and lifting the gallbladder from the cystic plate. The critical view of safety was achieved with the cystic artery, cystic duct, and liver bed visualized between them with no other structures. The common bile duct was also easily visible and protected. The artery was clipped with a single clip proximally and distally and divided as was the cystic duct with three clips on the proximal end. The  gallbladder was dissected from the liver plate using electrocautery. Once freed the gallbladder was placed in an endocatch bag and removed through the epigastric trocar site. A small amount of bleeding on the liver bed was controlled with cautery. Some bile (no stones) had been spilled from the gallbladder during its dissection from the liver bed. This was aspirated and the right upper quadrant was irrigated copiously until the effluent was clear. Hemostasis was once again confirmed, and reinspection of the abdomen revealed no injuries. The clips were well opposed without any bile leak from the duct or the liver bed, which was closely inspected. The 11mm trocar site in the epigastrium was closed with a 0 vicryl in the fascia under direct visualization using a PMI device. The abdomen was desufflated and all trocars removed. The skin incisions were closed with running subcuticular monocryl and Dermabond. The patient was awakened, extubated and transported to the recovery room in stable condition.   All counts were correct at the completion of the case.

## 2018-03-09 NOTE — Anesthesia Preprocedure Evaluation (Addendum)
Anesthesia Evaluation  Patient identified by MRN, date of birth, ID band Patient awake    Reviewed: Allergy & Precautions, NPO status , Patient's Chart, lab work & pertinent test results  History of Anesthesia Complications (+) PONV  Airway Mallampati: II  TM Distance: >3 FB     Dental  (+) Teeth Intact, Dental Advisory Given   Pulmonary neg pulmonary ROS,    breath sounds clear to auscultation       Cardiovascular hypertension,  Rhythm:Regular Rate:Normal     Neuro/Psych  Headaches,    GI/Hepatic Neg liver ROS, History noted. CG   Endo/Other  negative endocrine ROS  Renal/GU negative Renal ROS     Musculoskeletal   Abdominal   Peds  Hematology   Anesthesia Other Findings   Reproductive/Obstetrics                            Anesthesia Physical Anesthesia Plan  ASA: III  Anesthesia Plan: General   Post-op Pain Management:    Induction: Intravenous  PONV Risk Score and Plan: Ondansetron, Dexamethasone and Midazolam  Airway Management Planned: Oral ETT  Additional Equipment:   Intra-op Plan:   Post-operative Plan: Extubation in OR  Informed Consent: I have reviewed the patients History and Physical, chart, labs and discussed the procedure including the risks, benefits and alternatives for the proposed anesthesia with the patient or authorized representative who has indicated his/her understanding and acceptance.   Dental advisory given  Plan Discussed with: CRNA and Anesthesiologist  Anesthesia Plan Comments:         Anesthesia Quick Evaluation

## 2018-03-09 NOTE — Anesthesia Procedure Notes (Signed)

## 2018-03-10 ENCOUNTER — Encounter (HOSPITAL_COMMUNITY): Payer: Self-pay | Admitting: Surgery

## 2020-03-31 ENCOUNTER — Other Ambulatory Visit: Payer: Self-pay | Admitting: Obstetrics and Gynecology

## 2020-03-31 DIAGNOSIS — R928 Other abnormal and inconclusive findings on diagnostic imaging of breast: Secondary | ICD-10-CM

## 2020-04-08 ENCOUNTER — Ambulatory Visit: Payer: 59

## 2020-04-08 ENCOUNTER — Ambulatory Visit
Admission: RE | Admit: 2020-04-08 | Discharge: 2020-04-08 | Disposition: A | Discharge status: Home / Self Care | Payer: 59 | Source: Ambulatory Visit | Attending: Obstetrics and Gynecology | Admitting: Obstetrics and Gynecology

## 2020-04-08 ENCOUNTER — Other Ambulatory Visit: Payer: Self-pay

## 2020-04-08 DIAGNOSIS — R928 Other abnormal and inconclusive findings on diagnostic imaging of breast: Secondary | ICD-10-CM

## 2021-09-24 ENCOUNTER — Other Ambulatory Visit (HOSPITAL_COMMUNITY): Payer: Self-pay | Admitting: Cardiovascular Disease

## 2021-09-24 ENCOUNTER — Other Ambulatory Visit: Payer: Self-pay | Admitting: Cardiovascular Disease

## 2021-09-24 DIAGNOSIS — R079 Chest pain, unspecified: Secondary | ICD-10-CM

## 2021-11-01 ENCOUNTER — Encounter (HOSPITAL_COMMUNITY): Payer: Self-pay

## 2022-05-16 IMAGING — MG MM DIGITAL DIAGNOSTIC UNILAT*R* W/ TOMO W/ CAD
4 series · 4 of 12 positions shown · non-contrast
Comparison: Previous exams including recent screening mammogram
dated 03/24/2020.

CLINICAL DATA: Patient returns today to evaluate a possible RIGHT
breast asymmetry questioned on recent screening mammogram. History
of breast reduction surgery.

EXAM:
DIGITAL DIAGNOSTIC UNILATERAL RIGHT MAMMOGRAM WITH TOMO AND CAD

[R MLO synth-2D]
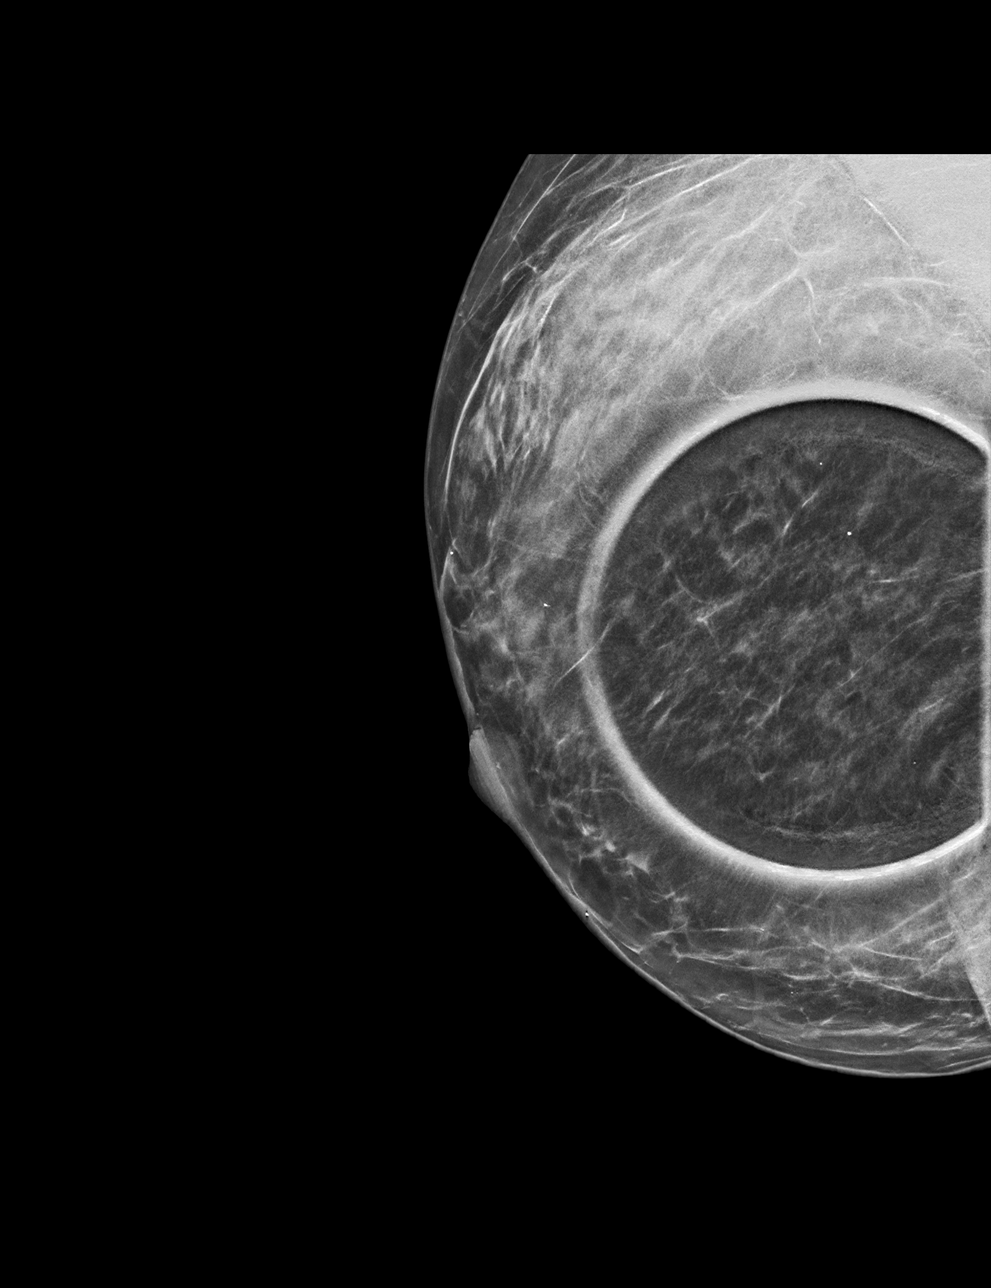

[R ML synth-2D]
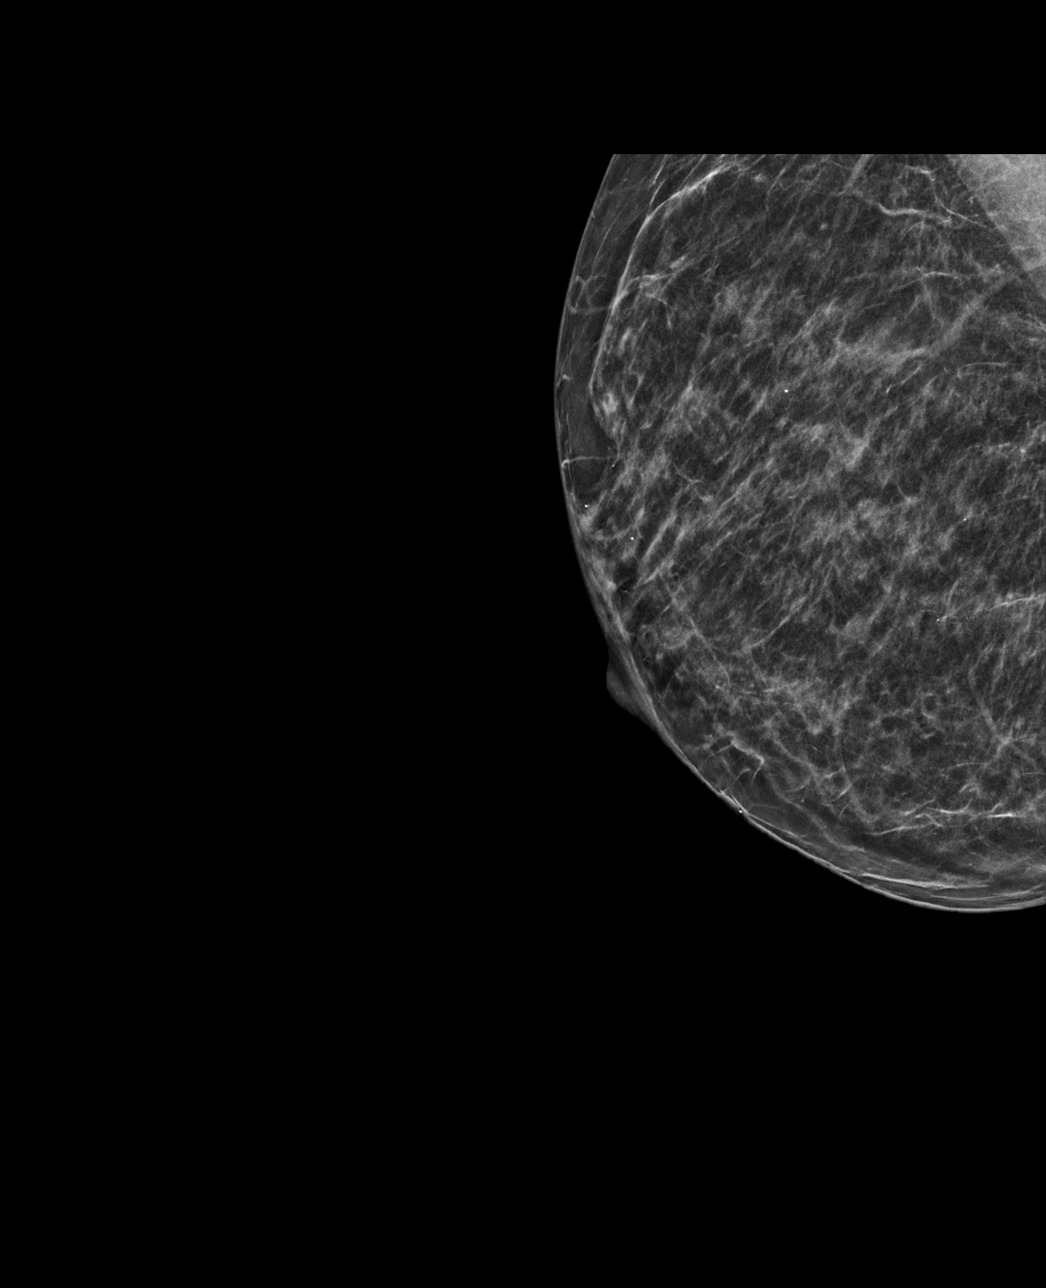

[R MLO tomo · tomo slice 27/54.0]
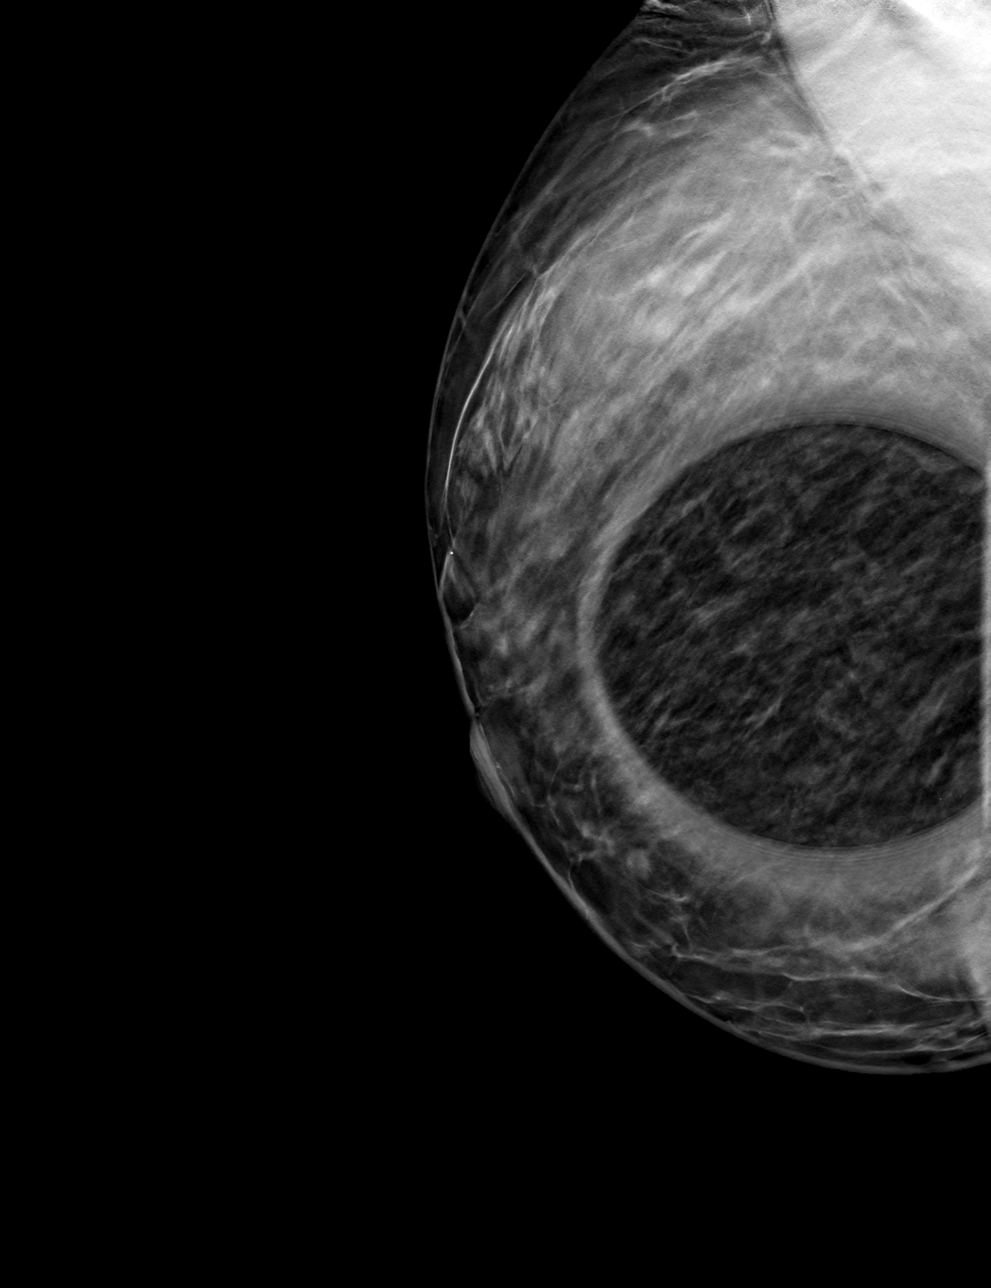

[R ML tomo · tomo slice 27/52.0]
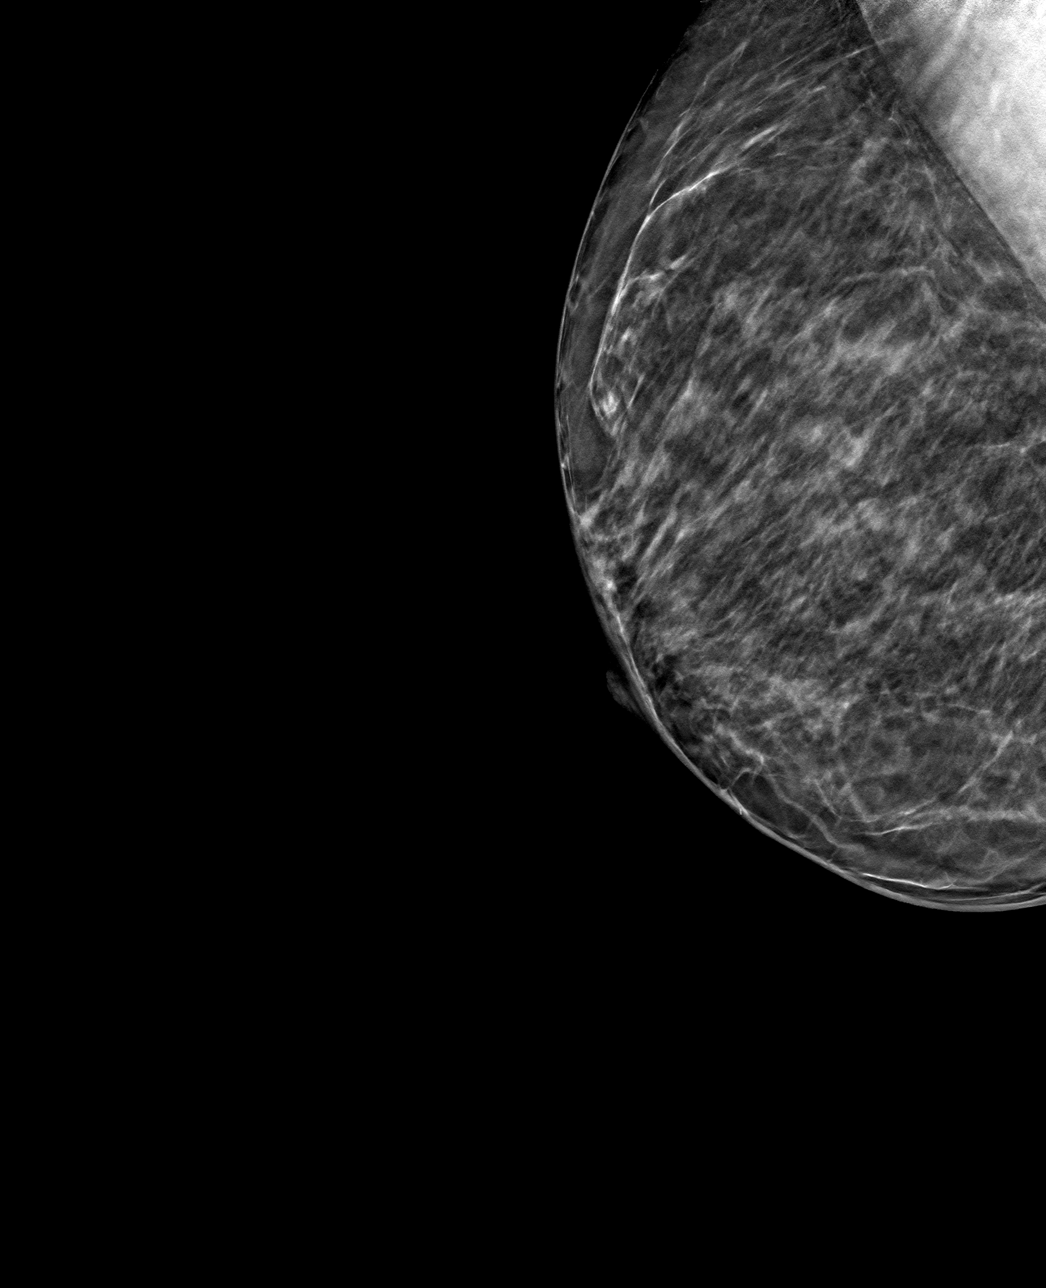

[4 of 12 positions shown; findings below may reference images not displayed]

ACR Breast Density Category b: There are scattered areas of
fibroglandular density.
FINDINGS: On today's additional diagnostic views, including spot compression
with 3D tomosynthesis, there is no persistent asymmetry within the
retroareolar RIGHT breast indicating superimposition of normal
fibroglandular tissues.

Mammographic images were processed with CAD.
IMPRESSION: No evidence of malignancy. Patient may return to routine annual
bilateral screening mammogram schedule.

RECOMMENDATION:
Screening mammogram in one year.(Code:[id number])

I have discussed the findings and recommendations with the patient.
If applicable, a reminder letter will be sent to the patient
regarding the next appointment.

BI-RADS CATEGORY  1: Negative.

## 2023-07-03 NOTE — Progress Notes (Signed)
 Cardiology Office Note:    Date:  07/05/2023   ID:  Theresa Bradford, DOB 1961-05-19, MRN [id number]  PCP:  Allyson Blackbird, PA  Cardiologist:  None  Electrophysiologist:  None   Referring MD: Allyson Blackbird, PA   Chief Complaint  Patient presents with   Palpitations    History of Present Illness:    Theresa Bradford is a 63 y.o. female with a hx of hyperlipidemia, prediabetes, hypertension who is referred by Blackbird Allyson, PA for evaluation of palpitations.  She reports she has episodes where she feels like heart is racing.  Can occur with caffeine or when she exercises but will sometimes wake up during the night with it.  Lasts for short duration.  Denies any chest pain, dyspnea, lightheadedness, syncope, lower extremity edema.  She walks 5 to 6 days/week for 1 hour.  Denies exertional symptoms.  No smoking history.  No family history of heart disease.  Reports BP 110s over 70s when she checks at home, has not taken her antihypertensive this morning.   Past Medical History:  Diagnosis Date   Arthritis    Frequent sinus infections    Hypercholesteremia    Hypertension    Menopause    PONV (postoperative nausea and vomiting)    also very itchy   Pre-diabetes    Prediabetes    Vitamin D deficiency     Past Surgical History:  Procedure Laterality Date   BREAST REDUCTION SURGERY  2008   CESAREAN SECTION      x 2   CHOLECYSTECTOMY N/A 03/09/2018   Procedure: LAPAROSCOPIC CHOLECYSTECTOMY;  Surgeon: Signe Mitzie LABOR, MD;  Location: [facility] OR;  Service: General;  Laterality: N/A;   COLONOSCOPY     LAPAROSCOPIC APPENDECTOMY N/A 07/28/2016   Procedure: APPENDECTOMY LAPAROSCOPIC;  Surgeon: Mitzie LABOR Signe, MD;  Location: [facility] OR;  Service: General;  Laterality: N/A;   REDUCTION MAMMAPLASTY Bilateral    TUBAL LIGATION     tummy tuck      Current Medications: Current Meds  Medication Sig   cetirizine (ZYRTEC) 10 MG tablet Take 10 mg by mouth daily.   clonazePAM  (KLONOPIN ) 0.5 MG tablet  Take 0.25-0.5 mg by mouth daily as needed for anxiety.    diltiazem  (CARDIZEM  CD) 120 MG 24 hr capsule Take 120 mg by mouth daily.      Allergies:   Beta adrenergic blockers, Adhesive [tape], Caffeine, Ibuprofen , Latex, and Macrobid  [nitrofurantoin ]   Social History   Socioeconomic History   Marital status: Single    Spouse name: Not on file   Number of children: Not on file   Years of education: Not on file   Highest education level: Not on file  Occupational History   Not on file  Tobacco Use   Smoking status: Never   Smokeless tobacco: Never  Vaping Use   Vaping status: Never Used  Substance and Sexual Activity   Alcohol use: No   Drug use: No   Sexual activity: Not Currently    Partners: Male    Comment: single  Other Topics Concern   Not on file  Social History Narrative   Not on file   Social Drivers of Health   Financial Resource Strain: Not on file  Food Insecurity: Not on file  Transportation Needs: Not on file  Physical Activity: Not on file  Stress: Not on file  Social Connections: Not on file     Family History: The patient's family history includes Anxiety disorder in her mother; Diabetes in  her father; Hypertension in her father and mother.  ROS:   Please see the history of present illness.     All other systems reviewed and are negative.  EKGs/Labs/Other Studies Reviewed:    The following studies were reviewed today:   EKG:   07/04/2023: Normal sinus rhythm, rate 75, no ST abnormalities  Recent Labs: No results found for requested labs within last 365 days.  Recent Lipid Panel No results found for: CHOL, TRIG, HDL, CHOLHDL, VLDL, LDLCALC, LDLDIRECT  Physical Exam:    VS:  BP (!) 140/94   Pulse 75   Ht 5' 4 (1.626 m)   Wt 176 lb 9.6 oz (80.1 kg)   SpO2 99%   BMI 30.31 kg/m     Wt Readings from Last 3 Encounters:  07/04/23 176 lb 9.6 oz (80.1 kg)  03/09/18 170 lb (77.1 kg)  03/02/18 170 lb (77.1 kg)     GEN:  Well  nourished, well developed in no acute distress HEENT: Normal NECK: No JVD; No carotid bruits LYMPHATICS: No lymphadenopathy CARDIAC: RRR, no murmurs, rubs, gallops RESPIRATORY:  Clear to auscultation without rales, wheezing or rhonchi  ABDOMEN: Soft, non-tender, non-distended MUSCULOSKELETAL:  No edema; No deformity  SKIN: Warm and dry NEUROLOGIC:  Alert and oriented x 3 PSYCHIATRIC:  Normal affect   ASSESSMENT:    1. Palpitations   2. Pre-op evaluation   3. Hyperlipidemia, unspecified hyperlipidemia type   4. Essential hypertension    PLAN:    Palpitations: Description concerning for arrhythmia, will evaluate with Zio patch x 7 days.  Check echocardiogram to rule out structural heart disease  Preop evaluation: Planning facelift in 1 month.  No exertional chest pain or dyspnea.  Excellent functional capacity.  Overall would classify as low risk from cardiac standpoint.  Planning echocardiogram for palpitations as above, if unremarkable no further cardiac workup recommended  Hypertension: On diltiazem  120 mg daily.  BP elevated in clinic today but reports she did not take her diltiazem  yet and BP has been well-controlled at home.  Asked to check BP twice daily for next week and let us  know results.  Would recommend bringing home monitor to next appointment to calibrate  Hyperlipidemia: LDL 120 on 04/23/2023.  10-year ASCVD risk score 9%, meets indication for starting statin.  Discussed starting statin, she would like to hold off at this time.  Recommend calcium score for further risk stratification  Prediabetes: A1c 5.8%  RTC in 4 months   Medication Adjustments/Labs and Tests Ordered: Current medicines are reviewed at length with the patient today.  Concerns regarding medicines are outlined above.  Orders Placed This Encounter  Procedures   CT CARDIAC SCORING (SELF PAY ONLY)   LONG TERM MONITOR (3-14 DAYS)   EKG 12-Lead   ECHOCARDIOGRAM COMPLETE   No orders of the defined  types were placed in this encounter.   Patient Instructions  Medication Instructions:  Continue current medications *If you need a refill on your cardiac medications before your next appointment, please call your pharmacy*   Lab Work: none If you have labs (blood work) drawn today and your tests are completely normal, you will receive your results only by: MyChart Message (if you have MyChart) OR A paper copy in the mail If you have any lab test that is abnormal or we need to change your treatment, we will call you to review the results.   Testing/Procedures: Zio  ZIO XT- Long Term Monitor Instructions  Your physician has requested you wear  a ZIO patch monitor for 7 days.  This is a single patch monitor. [employer] supplies one patch monitor per enrollment. Additional stickers are not available. Please do not apply patch if you will be having a Nuclear Stress Test,  Echocardiogram, Cardiac CT, MRI, or Chest Xray during the period you would be wearing the  monitor. The patch cannot be worn during these tests. You cannot remove and re-apply the  ZIO XT patch monitor.  Your ZIO patch monitor will be mailed 3 day USPS to your address on file. It may take 3-5 days  to receive your monitor after you have been enrolled.  Once you have received your monitor, please review the enclosed instructions. Your monitor  has already been registered assigning a specific monitor serial # to you.  Billing and Patient Assistance Program Information  We have supplied [employer] with any of your insurance information on file for billing purposes. [employer] offers a sliding scale Patient Assistance Program for patients that do not have  insurance, or whose insurance does not completely cover the cost of the ZIO monitor.  You must apply for the Patient Assistance Program to qualify for this discounted rate.  To apply, please call [employer] at [phone], select option 4, select option 2, ask to apply for   Patient Assistance Program. Meredeth will ask your household income, and how many people  are in your household. They will quote your out-of-pocket cost based on that information.  [employer] will also be able to set up a 67-month, interest-free payment plan if needed.  Applying the monitor   Shave hair from upper left chest.  Hold abrader disc by orange tab. Rub abrader in 40 strokes over the upper left chest as  indicated in your monitor instructions.  Clean area with 4 enclosed alcohol pads. Let dry.  Apply patch as indicated in monitor instructions. Patch will be placed under collarbone on left  side of chest with arrow pointing upward.  Rub patch adhesive wings for 2 minutes. Remove white label marked 1. Remove the white  label marked 2. Rub patch adhesive wings for 2 additional minutes.  While looking in a mirror, press and release button in center of patch. A small green light will  flash 3-4 times. This will be your only indicator that the monitor has been turned on.  Do not shower for the first 24 hours. You may shower after the first 24 hours.  Press the button if you feel a symptom. You will hear a small click. Record Date, Time and  Symptom in the Patient Logbook.  When you are ready to remove the patch, follow instructions on the last 2 pages of Patient  Logbook. Stick patch monitor onto the last page of Patient Logbook.  Place Patient Logbook in the blue and white box. Use locking tab on box and tape box closed  securely. The blue and white box has prepaid postage on it. Please place it in the mailbox as  soon as possible. Your physician should have your test results approximately 7 days after the  monitor has been mailed back to [facility].  Call [facility] Customer Care at [phone] if you have questions regarding  your ZIO XT patch monitor. Call them immediately if you see an orange light blinking on your  monitor.  If your monitor falls off in less than 4  days, contact our Monitor department at [phone].  If your monitor becomes loose or falls off after 4 days call [employer] at  [phone] for  suggestions on securing your monitor    ECHO  Your physician has requested that you have an echocardiogram. Echocardiography is a painless test that uses sound waves to create images of your heart. It provides your doctor with information about the size and shape of your heart and how well your heart's chambers and valves are working. This procedure takes approximately one hour. There are no restrictions for this procedure. Please do NOT wear cologne, perfume, aftershave, or lotions (deodorant is allowed). Please arrive 15 minutes prior to your appointment time.  Please note: We ask at that you not bring children with you during ultrasound (echo/ vascular) testing. Due to room size and safety concerns, children are not allowed in the ultrasound rooms during exams. Our front office staff cannot provide observation of children in our lobby area while testing is being conducted. An adult accompanying a patient to their appointment will only be allowed in the ultrasound room at the discretion of the ultrasound technician under special circumstances. We apologize for any inconvenience.    Follow-Up: At [facility], you and your health needs are our priority.  As part of our continuing mission to provide you with exceptional heart care, we have created designated Provider Care Teams.  These Care Teams include your primary Cardiologist (physician) and Advanced Practice Providers (APPs -  Physician Assistants and Nurse Practitioners) who all work together to provide you with the care you need, when you need it.  We recommend signing up for the patient portal called MyChart.  Sign up information is provided on this After Visit Summary.  MyChart is used to connect with patients for Virtual Visits (Telemedicine).  Patients are able to view lab/test  results, encounter notes, upcoming appointments, etc.  Non-urgent messages can be sent to your provider as well.   To learn more about what you can do with MyChart, go to [email or web address].    Your next appointment:   4 month(s)  Provider:   Dr. Kate  Other Instructions Dr. Kate has ordered a CT coronary calcium score.   Test locations:  [facility]  [facility]  This is $99 out of pocket.   Coronary CalciumScan A coronary calcium scan is an imaging test used to look for deposits of calcium and other fatty materials (plaques) in the inner lining of the blood vessels of the heart (coronary arteries). These deposits of calcium and plaques can partly clog and narrow the coronary arteries without producing any symptoms or warning signs. This puts a person at risk for a heart attack. This test can detect these deposits before symptoms develop. Tell a health care provider about: Any allergies you have. All medicines you are taking, including vitamins, herbs, Bradford drops, creams, and over-the-counter medicines. Any problems you or family members have had with anesthetic medicines. Any blood disorders you have. Any surgeries you have had. Any medical conditions you have. Whether you are pregnant or may be pregnant. What are the risks? Generally, this is a safe procedure. However, problems may occur, including: Harm to a pregnant woman and her unborn baby. This test involves the use of radiation. Radiation exposure can be dangerous to a pregnant woman and her unborn baby. If you are pregnant, you generally should not have this procedure done. Slight increase in the risk of cancer. This is because of the radiation involved in the test. What happens before the procedure? No preparation is  needed for this procedure. What happens during the procedure? You will undress and remove any jewelry  around your neck or chest. You will put on a hospital gown. Sticky electrodes will be placed on your chest. The electrodes will be connected to an electrocardiogram (ECG) machine to record a tracing of the electrical activity of your heart. A CT scanner will take pictures of your heart. During this time, you will be asked to lie still and hold your breath for 2-3 seconds while a picture of your heart is being taken. The procedure may vary among health care providers and hospitals. What happens after the procedure? You can get dressed. You can return to your normal activities. It is up to you to get the results of your test. Ask your health care provider, or the department that is doing the test, when your results will be ready. Summary A coronary calcium scan is an imaging test used to look for deposits of calcium and other fatty materials (plaques) in the inner lining of the blood vessels of the heart (coronary arteries). Generally, this is a safe procedure. Tell your health care provider if you are pregnant or may be pregnant. No preparation is needed for this procedure. A CT scanner will take pictures of your heart. You can return to your normal activities after the scan is done. This information is not intended to replace advice given to you by your health care provider. Make sure you discuss any questions you have with your health care provider. Document Released: 11/09/2007 Document Revised: 04/01/2016 Document Reviewed: 04/01/2016 Elsevier Interactive Patient Education  2017 [employer]    Please check blood pressure twice a day for next week and send via my chart         Signed, Lonni LITTIE Nanas, MD  07/05/2023 9:30 PM    [facility]

## 2023-07-04 ENCOUNTER — Ambulatory Visit: Payer: 59 | Attending: Cardiology | Admitting: Cardiology

## 2023-07-04 ENCOUNTER — Ambulatory Visit: Payer: 59 | Attending: Cardiology

## 2023-07-04 ENCOUNTER — Encounter: Payer: Self-pay | Admitting: Cardiology

## 2023-07-04 VITALS — BP 140/94 | HR 75 | Ht 64.0 in | Wt 176.6 lb

## 2023-07-04 DIAGNOSIS — I10 Essential (primary) hypertension: Secondary | ICD-10-CM

## 2023-07-04 DIAGNOSIS — Z01818 Encounter for other preprocedural examination: Secondary | ICD-10-CM | POA: Diagnosis not present

## 2023-07-04 DIAGNOSIS — E785 Hyperlipidemia, unspecified: Secondary | ICD-10-CM

## 2023-07-04 DIAGNOSIS — R002 Palpitations: Secondary | ICD-10-CM | POA: Diagnosis not present

## 2023-07-04 NOTE — Progress Notes (Unsigned)
 Enrolled patient for a 7 day Zio XT monitor to be mailed to patients home.

## 2023-07-04 NOTE — Patient Instructions (Addendum)
 Medication Instructions:  Continue current medications *If you need a refill on your cardiac medications before your next appointment, please call your pharmacy*   Lab Work: none If you have labs (blood work) drawn today and your tests are completely normal, you will receive your results only by: MyChart Message (if you have MyChart) OR A paper copy in the mail If you have any lab test that is abnormal or we need to change your treatment, we will call you to review the results.   Testing/Procedures: Zio  ZIO XT- Long Term Monitor Instructions  Your physician has requested you wear a ZIO patch monitor for 7 days.  This is a single patch monitor. [employer] supplies one patch monitor per enrollment. Additional stickers are not available. Please do not apply patch if you will be having a Nuclear Stress Test,  Echocardiogram, Cardiac CT, MRI, or Chest Xray during the period you would be wearing the  monitor. The patch cannot be worn during these tests. You cannot remove and re-apply the  ZIO XT patch monitor.  Your ZIO patch monitor will be mailed 3 day USPS to your address on file. It may take 3-5 days  to receive your monitor after you have been enrolled.  Once you have received your monitor, please review the enclosed instructions. Your monitor  has already been registered assigning a specific monitor serial # to you.  Billing and Patient Assistance Program Information  We have supplied [employer] with any of your insurance information on file for billing purposes. [employer] offers a sliding scale Patient Assistance Program for patients that do not have  insurance, or whose insurance does not completely cover the cost of the ZIO monitor.  You must apply for the Patient Assistance Program to qualify for this discounted rate.  To apply, please call [employer] at [phone], select option 4, select option 2, ask to apply for  Patient Assistance Program. Meredeth will ask your household income,  and how many people  are in your household. They will quote your out-of-pocket cost based on that information.  [employer] will also be able to set up a 49-month, interest-free payment plan if needed.  Applying the monitor   Shave hair from upper left chest.  Hold abrader disc by orange tab. Rub abrader in 40 strokes over the upper left chest as  indicated in your monitor instructions.  Clean area with 4 enclosed alcohol pads. Let dry.  Apply patch as indicated in monitor instructions. Patch will be placed under collarbone on left  side of chest with arrow pointing upward.  Rub patch adhesive wings for 2 minutes. Remove white label marked 1. Remove the white  label marked 2. Rub patch adhesive wings for 2 additional minutes.  While looking in a mirror, press and release button in center of patch. A small green light will  flash 3-4 times. This will be your only indicator that the monitor has been turned on.  Do not shower for the first 24 hours. You may shower after the first 24 hours.  Press the button if you feel a symptom. You will hear a small click. Record Date, Time and  Symptom in the Patient Logbook.  When you are ready to remove the patch, follow instructions on the last 2 pages of Patient  Logbook. Stick patch monitor onto the last page of Patient Logbook.  Place Patient Logbook in the blue and white box. Use locking tab on box and tape box closed  securely. The blue and  white box has prepaid postage on it. Please place it in the mailbox as  soon as possible. Your physician should have your test results approximately 7 days after the  monitor has been mailed back to [facility].  Call [facility] Customer Care at [phone] if you have questions regarding  your ZIO XT patch monitor. Call them immediately if you see an orange light blinking on your  monitor.  If your monitor falls off in less than 4 days, contact our Monitor department at [phone].  If your monitor  becomes loose or falls off after 4 days call [employer] at [phone] for  suggestions on securing your monitor    ECHO  Your physician has requested that you have an echocardiogram. Echocardiography is a painless test that uses sound waves to create images of your heart. It provides your doctor with information about the size and shape of your heart and how well your heart's chambers and valves are working. This procedure takes approximately one hour. There are no restrictions for this procedure. Please do NOT wear cologne, perfume, aftershave, or lotions (deodorant is allowed). Please arrive 15 minutes prior to your appointment time.  Please note: We ask at that you not bring children with you during ultrasound (echo/ vascular) testing. Due to room size and safety concerns, children are not allowed in the ultrasound rooms during exams. Our front office staff cannot provide observation of children in our lobby area while testing is being conducted. An adult accompanying a patient to their appointment will only be allowed in the ultrasound room at the discretion of the ultrasound technician under special circumstances. We apologize for any inconvenience.    Follow-Up: At [facility], you and your health needs are our priority.  As part of our continuing mission to provide you with exceptional heart care, we have created designated Provider Care Teams.  These Care Teams include your primary Cardiologist (physician) and Advanced Practice Providers (APPs -  Physician Assistants and Nurse Practitioners) who all work together to provide you with the care you need, when you need it.  We recommend signing up for the patient portal called MyChart.  Sign up information is provided on this After Visit Summary.  MyChart is used to connect with patients for Virtual Visits (Telemedicine).  Patients are able to view lab/test results, encounter notes, upcoming appointments, etc.  Non-urgent messages  can be sent to your provider as well.   To learn more about what you can do with MyChart, go to [email or web address].    Your next appointment:   4 month(s)  Provider:   Dr. Kate  Other Instructions Dr. Kate has ordered a CT coronary calcium score.   Test locations:  [facility]  [facility] at [facility]  This is $99 out of pocket.   Coronary CalciumScan A coronary calcium scan is an imaging test used to look for deposits of calcium and other fatty materials (plaques) in the inner lining of the blood vessels of the heart (coronary arteries). These deposits of calcium and plaques can partly clog and narrow the coronary arteries without producing any symptoms or warning signs. This puts a person at risk for a heart attack. This test can detect these deposits before symptoms develop. Tell a health care provider about: Any allergies you have. All medicines you are taking, including vitamins, herbs, eye drops, creams, and over-the-counter medicines. Any problems you or family members have had with anesthetic medicines.  Any blood disorders you have. Any surgeries you have had. Any medical conditions you have. Whether you are pregnant or may be pregnant. What are the risks? Generally, this is a safe procedure. However, problems may occur, including: Harm to a pregnant woman and her unborn baby. This test involves the use of radiation. Radiation exposure can be dangerous to a pregnant woman and her unborn baby. If you are pregnant, you generally should not have this procedure done. Slight increase in the risk of cancer. This is because of the radiation involved in the test. What happens before the procedure? No preparation is needed for this procedure. What happens during the procedure? You will undress and remove any jewelry around your neck or chest. You will put on a hospital gown. Sticky  electrodes will be placed on your chest. The electrodes will be connected to an electrocardiogram (ECG) machine to record a tracing of the electrical activity of your heart. A CT scanner will take pictures of your heart. During this time, you will be asked to lie still and hold your breath for 2-3 seconds while a picture of your heart is being taken. The procedure may vary among health care providers and hospitals. What happens after the procedure? You can get dressed. You can return to your normal activities. It is up to you to get the results of your test. Ask your health care provider, or the department that is doing the test, when your results will be ready. Summary A coronary calcium scan is an imaging test used to look for deposits of calcium and other fatty materials (plaques) in the inner lining of the blood vessels of the heart (coronary arteries). Generally, this is a safe procedure. Tell your health care provider if you are pregnant or may be pregnant. No preparation is needed for this procedure. A CT scanner will take pictures of your heart. You can return to your normal activities after the scan is done. This information is not intended to replace advice given to you by your health care provider. Make sure you discuss any questions you have with your health care provider. Document Released: 11/09/2007 Document Revised: 04/01/2016 Document Reviewed: 04/01/2016 Elsevier Interactive Patient Education  2017 [employer]    Please check blood pressure twice a day for next week and send via my chart

## 2023-07-08 ENCOUNTER — Ambulatory Visit (HOSPITAL_COMMUNITY)
Admission: RE | Admit: 2023-07-08 | Discharge: 2023-07-08 | Disposition: A | Discharge status: Home / Self Care | Payer: Self-pay | Source: Ambulatory Visit | Attending: Cardiology | Admitting: Cardiology

## 2023-07-08 DIAGNOSIS — E785 Hyperlipidemia, unspecified: Secondary | ICD-10-CM | POA: Insufficient documentation

## 2023-07-09 ENCOUNTER — Ambulatory Visit (HOSPITAL_COMMUNITY): Payer: 59 | Attending: Internal Medicine

## 2023-07-09 DIAGNOSIS — R002 Palpitations: Secondary | ICD-10-CM | POA: Diagnosis present

## 2023-07-09 DIAGNOSIS — I10 Essential (primary) hypertension: Secondary | ICD-10-CM

## 2023-07-09 LAB — ECHOCARDIOGRAM COMPLETE
Area-P 1/2: 2.71 cm2
S' Lateral: 2.2 cm

## 2023-07-13 DIAGNOSIS — R002 Palpitations: Secondary | ICD-10-CM | POA: Diagnosis not present

## 2023-07-16 ENCOUNTER — Encounter: Payer: Self-pay | Admitting: *Deleted

## 2023-08-25 ENCOUNTER — Encounter: Payer: Self-pay | Admitting: *Deleted

## 2023-11-07 ENCOUNTER — Ambulatory Visit: Payer: 59 | Admitting: Cardiology

## 2024-02-25 ENCOUNTER — Other Ambulatory Visit: Payer: Self-pay | Admitting: Obstetrics and Gynecology

## 2024-02-25 DIAGNOSIS — Z1231 Encounter for screening mammogram for malignant neoplasm of breast: Secondary | ICD-10-CM

## 2024-05-03 ENCOUNTER — Encounter: Payer: Self-pay | Admitting: Family

## 2024-05-06 ENCOUNTER — Ambulatory Visit: Payer: Self-pay | Admitting: *Deleted

## 2024-05-06 ENCOUNTER — Ambulatory Visit
Admission: RE | Admit: 2024-05-06 | Discharge: 2024-05-06 | Disposition: A | Discharge status: Home / Self Care | Payer: Self-pay | Source: Ambulatory Visit | Attending: Obstetrics and Gynecology | Admitting: Obstetrics and Gynecology

## 2024-05-06 VITALS — BP 142/93 | Wt 177.0 lb

## 2024-05-06 DIAGNOSIS — Z1231 Encounter for screening mammogram for malignant neoplasm of breast: Secondary | ICD-10-CM

## 2024-05-06 DIAGNOSIS — Z01419 Encounter for gynecological examination (general) (routine) without abnormal findings: Secondary | ICD-10-CM

## 2024-05-06 NOTE — Patient Instructions (Signed)
 Explained breast self awareness with [name]. Pap smear completed today. Let her know that her next Pap smear will be due based on the results of today's Pap smear. Referred patient to the [facility] for a screening mammogram on mobile unit. Appointment scheduled Thursday, May 06, 2024 at 1100. Patient aware of appointment and will be there. Let patient know will follow up with her within the next couple weeks with results of Pap smear by letter or phone. Informed patient that the Breast Center will follow up with her within the next couple of weeks with results of her mammogram by letter or phone. [name] verbalized understanding.  Ethyl Vila, Wanda Ship, RN 10:56 AM

## 2024-05-06 NOTE — Progress Notes (Signed)
 Ms. Theresa Bradford is a 63 y.o. No obstetric history on file. female who presents to [facility] clinic today with no complaints.    Pap Smear: Pap smear completed today. Last Pap smear was 5/232024 at [facility] clinic and was normal with negative HPV. Per patient has history of an abnormal Pap smear 15 years ago that a colposcopy and LEEP were completed for follow up. Patient stated that all Pap smears have been normal since LEEP and she has had at least three normal Pap smears since. Last Pap smear result is available in Epic.   Physical exam: Breasts Breasts symmetrical. Scars observed bilateral lower breasts due to history of breast reduction in 2012. No nipple retraction bilateral breasts. No nipple discharge bilateral breasts. No lymphadenopathy. No lumps palpated bilateral breasts. No complaints of pain or tenderness on exam.      MM DIAG BREAST TOMO UNI RIGHT Result Date: 04/08/2020 CLINICAL DATA:  Patient returns today to evaluate a possible RIGHT breast asymmetry questioned on recent screening mammogram. History of breast reduction surgery. EXAM: DIGITAL DIAGNOSTIC UNILATERAL RIGHT MAMMOGRAM WITH TOMO AND CAD COMPARISON:  Previous exams including recent screening mammogram dated 03/24/2020. ACR Breast Density Category b: There are scattered areas of fibroglandular density. FINDINGS: On today's additional diagnostic views, including spot compression with 3D tomosynthesis, there is no persistent asymmetry within the retroareolar RIGHT breast indicating superimposition of normal fibroglandular tissues. Mammographic images were processed with CAD. IMPRESSION: No evidence of malignancy. Patient may return to routine annual bilateral screening mammogram schedule. RECOMMENDATION: Screening mammogram in one year.(Code:[id number]-01Y) I have discussed the findings and recommendations with the patient. If applicable, a reminder letter will be sent to the patient regarding the next appointment. BI-RADS  CATEGORY  1: Negative. Electronically Signed   By: Theresa Bradford M.D.   On: 04/08/2020 08:41    Pelvic/Bimanual Ext Genitalia No lesions, no swelling and no discharge observed on external genitalia.        Vagina Vagina pink and normal texture. No lesions or discharge observed in vagina.        Cervix Cervix is present. Cervix pink and of normal texture. No discharge observed.    Uterus Uterus is present and palpable. Uterus in normal position and normal size.        Adnexae Bilateral ovaries present and palpable. No tenderness on palpation.         Rectovaginal No rectal exam completed today since patient had no rectal complaints. No skin abnormalities observed on exam.     Smoking History: Patient has never smoked.   Patient Navigation: Patient education provided. Access to services provided for patient through [facility] program.   Colorectal Cancer Screening: Per patient has had colonoscopy completed on 06/03/2020. No complaints today.    Breast and Cervical Cancer Risk Assessment: Patient has family history of a maternal cousin having breast cancer. Patient has no known genetic mutations or history of radiation treatment to the chest before age 52. Per patient has history of cervical dysplasia. Patient has no history of being immunocompromised or DES exposure in-utero.  Risk Scores as of Encounter on 05/06/2024     Theresa Bradford           5-year 1.59%   Lifetime 6.34%            Last calculated by Theresa Bradford, Theresa Bradford, Theresa Bradford on 05/06/2024 at 11:37 AM        A: [facility]CP exam with pap smear No complaints.  P: Referred patient to the [facility] for a screening mammogram on mobile unit. Appointment scheduled Thursday, May 06, 2024 at 1100.  Driscilla Wanda SQUIBB, RN 05/06/2024 10:56 AM

## 2024-05-07 LAB — CYTOLOGY - PAP
Comment: NEGATIVE
Diagnosis: NEGATIVE
High risk HPV: NEGATIVE

## 2024-05-10 ENCOUNTER — Ambulatory Visit: Payer: Self-pay

## 2024-05-13 ENCOUNTER — Ambulatory Visit: Payer: Self-pay | Admitting: Obstetrics & Gynecology

## 2024-06-15 ENCOUNTER — Encounter (HOSPITAL_COMMUNITY): Payer: Self-pay

## 2024-06-15 ENCOUNTER — Other Ambulatory Visit (HOSPITAL_COMMUNITY): Payer: Self-pay

## 2024-06-15 MED ORDER — DILTIAZEM HCL ER 60 MG PO CP12
60.0000 mg | ORAL_CAPSULE | Freq: Every day | ORAL | 3 refills | Status: AC
  Filled 2024-06-15: qty 90, 90d supply, fill #0

## 2024-06-29 ENCOUNTER — Other Ambulatory Visit (HOSPITAL_COMMUNITY): Payer: Self-pay

## 2024-06-29 MED ORDER — ROSUVASTATIN CALCIUM 5 MG PO TABS
5.0000 mg | ORAL_TABLET | Freq: Every day | ORAL | 3 refills | Status: AC
  Filled 2024-06-29: qty 30, 30d supply, fill #0
